# Patient Record
Sex: Male | Born: 1950 | Race: White | Hispanic: Yes | Marital: Single | Smoking: Current every day smoker
Health system: Southern US, Community
[De-identification: ages and names within clinical notes are randomized; demographics above are authoritative.]

## PROBLEM LIST (undated history)

## (undated) DIAGNOSIS — F1010 Alcohol abuse, uncomplicated: Secondary | ICD-10-CM

---

## 2015-09-19 ENCOUNTER — Encounter (HOSPITAL_BASED_OUTPATIENT_CLINIC_OR_DEPARTMENT_OTHER): Payer: Self-pay | Admitting: *Deleted

## 2015-09-19 ENCOUNTER — Inpatient Hospital Stay (HOSPITAL_BASED_OUTPATIENT_CLINIC_OR_DEPARTMENT_OTHER)
Admission: EM | Admit: 2015-09-19 | Discharge: 2015-10-03 | DRG: 438 | Disposition: A | Discharge status: Home / Self Care | Payer: Medicare Other | Attending: Internal Medicine | Admitting: Internal Medicine

## 2015-09-19 ENCOUNTER — Emergency Department (HOSPITAL_BASED_OUTPATIENT_CLINIC_OR_DEPARTMENT_OTHER): Payer: Medicare Other

## 2015-09-19 DIAGNOSIS — I959 Hypotension, unspecified: Secondary | ICD-10-CM | POA: Diagnosis present

## 2015-09-19 DIAGNOSIS — K861 Other chronic pancreatitis: Secondary | ICD-10-CM | POA: Diagnosis present

## 2015-09-19 DIAGNOSIS — R112 Nausea with vomiting, unspecified: Secondary | ICD-10-CM | POA: Diagnosis present

## 2015-09-19 DIAGNOSIS — K8020 Calculus of gallbladder without cholecystitis without obstruction: Secondary | ICD-10-CM | POA: Diagnosis present

## 2015-09-19 DIAGNOSIS — E876 Hypokalemia: Secondary | ICD-10-CM | POA: Diagnosis present

## 2015-09-19 DIAGNOSIS — Z789 Other specified health status: Secondary | ICD-10-CM | POA: Diagnosis present

## 2015-09-19 DIAGNOSIS — D509 Iron deficiency anemia, unspecified: Secondary | ICD-10-CM | POA: Diagnosis present

## 2015-09-19 DIAGNOSIS — K922 Gastrointestinal hemorrhage, unspecified: Secondary | ICD-10-CM | POA: Diagnosis present

## 2015-09-19 DIAGNOSIS — Z681 Body mass index (BMI) 19 or less, adult: Secondary | ICD-10-CM

## 2015-09-19 DIAGNOSIS — F1020 Alcohol dependence, uncomplicated: Secondary | ICD-10-CM | POA: Diagnosis present

## 2015-09-19 DIAGNOSIS — F1090 Alcohol use, unspecified, uncomplicated: Secondary | ICD-10-CM | POA: Diagnosis present

## 2015-09-19 DIAGNOSIS — K432 Incisional hernia without obstruction or gangrene: Secondary | ICD-10-CM | POA: Diagnosis present

## 2015-09-19 DIAGNOSIS — K760 Fatty (change of) liver, not elsewhere classified: Secondary | ICD-10-CM | POA: Diagnosis present

## 2015-09-19 DIAGNOSIS — R109 Unspecified abdominal pain: Secondary | ICD-10-CM | POA: Insufficient documentation

## 2015-09-19 DIAGNOSIS — F17210 Nicotine dependence, cigarettes, uncomplicated: Secondary | ICD-10-CM | POA: Diagnosis present

## 2015-09-19 DIAGNOSIS — R1115 Cyclical vomiting syndrome unrelated to migraine: Secondary | ICD-10-CM

## 2015-09-19 DIAGNOSIS — I493 Ventricular premature depolarization: Secondary | ICD-10-CM | POA: Diagnosis present

## 2015-09-19 DIAGNOSIS — Z903 Acquired absence of stomach [part of]: Secondary | ICD-10-CM

## 2015-09-19 DIAGNOSIS — R739 Hyperglycemia, unspecified: Secondary | ICD-10-CM | POA: Diagnosis present

## 2015-09-19 DIAGNOSIS — E871 Hypo-osmolality and hyponatremia: Secondary | ICD-10-CM | POA: Diagnosis present

## 2015-09-19 DIAGNOSIS — Z7289 Other problems related to lifestyle: Secondary | ICD-10-CM | POA: Diagnosis present

## 2015-09-19 DIAGNOSIS — R1013 Epigastric pain: Secondary | ICD-10-CM | POA: Diagnosis not present

## 2015-09-19 DIAGNOSIS — E860 Dehydration: Secondary | ICD-10-CM | POA: Diagnosis present

## 2015-09-19 DIAGNOSIS — K859 Acute pancreatitis without necrosis or infection, unspecified: Secondary | ICD-10-CM | POA: Diagnosis not present

## 2015-09-19 DIAGNOSIS — E43 Unspecified severe protein-calorie malnutrition: Secondary | ICD-10-CM | POA: Insufficient documentation

## 2015-09-19 DIAGNOSIS — F1010 Alcohol abuse, uncomplicated: Secondary | ICD-10-CM | POA: Diagnosis present

## 2015-09-19 HISTORY — DX: Alcohol abuse, uncomplicated: F10.10

## 2015-09-19 LAB — CBC WITH DIFFERENTIAL/PLATELET
BASOS ABS: 0 10*3/uL (ref 0.0–0.1)
BASOS PCT: 0 %
EOS PCT: 0 %
Eosinophils Absolute: 0 10*3/uL (ref 0.0–0.7)
HCT: 27 % — ABNORMAL LOW (ref 39.0–52.0)
Hemoglobin: 9.6 g/dL — ABNORMAL LOW (ref 13.0–17.0)
LYMPHS PCT: 8 %
Lymphs Abs: 1.2 10*3/uL (ref 0.7–4.0)
MCH: 33.1 pg (ref 26.0–34.0)
MCHC: 35.6 g/dL (ref 30.0–36.0)
MCV: 93.1 fL (ref 78.0–100.0)
Monocytes Absolute: 1.5 10*3/uL — ABNORMAL HIGH (ref 0.1–1.0)
Monocytes Relative: 11 %
NEUTROS ABS: 11.1 10*3/uL — AB (ref 1.7–7.7)
Neutrophils Relative %: 81 %
PLATELETS: 378 10*3/uL (ref 150–400)
RBC: 2.9 MIL/uL — AB (ref 4.22–5.81)
RDW: 12.3 % (ref 11.5–15.5)
WBC: 13.9 10*3/uL — AB (ref 4.0–10.5)

## 2015-09-19 LAB — COMPREHENSIVE METABOLIC PANEL
ALBUMIN: 2 g/dL — AB (ref 3.5–5.0)
ALBUMIN: 1.9 g/dL — AB (ref 3.5–5.0)
ALK PHOS: 161 U/L — AB (ref 38–126)
ALT: 17 U/L (ref 17–63)
ALT: 17 U/L (ref 17–63)
AST: 21 U/L (ref 15–41)
AST: 23 U/L (ref 15–41)
Alkaline Phosphatase: 160 U/L — ABNORMAL HIGH (ref 38–126)
Anion gap: 12 (ref 5–15)
Anion gap: 13 (ref 5–15)
BUN: 7 mg/dL (ref 6–20)
BUN: 7 mg/dL (ref 6–20)
CALCIUM: 7.2 mg/dL — AB (ref 8.9–10.3)
CHLORIDE: 71 mmol/L — AB (ref 101–111)
CO2: 40 mmol/L — AB (ref 22–32)
CO2: 38 mmol/L — AB (ref 22–32)
CREATININE: 0.64 mg/dL (ref 0.61–1.24)
CREATININE: 0.6 mg/dL — AB (ref 0.61–1.24)
Calcium: 7.5 mg/dL — ABNORMAL LOW (ref 8.9–10.3)
Chloride: 72 mmol/L — ABNORMAL LOW (ref 101–111)
GFR calc Af Amer: >60 mL/min (ref >60)
GFR calc Af Amer: >60 mL/min (ref >60)
GFR calc non Af Amer: >60 mL/min (ref >60)
GLUCOSE: 108 mg/dL — AB (ref 65–99)
GLUCOSE: 99 mg/dL (ref 65–99)
POTASSIUM: 1.8 mmol/L — AB (ref 3.5–5.1)
Potassium: 2 mmol/L — CL (ref 3.5–5.1)
Sodium: 123 mmol/L — ABNORMAL LOW (ref 135–145)
Sodium: 123 mmol/L — ABNORMAL LOW (ref 135–145)
Total Bilirubin: 1 mg/dL (ref 0.3–1.2)
Total Bilirubin: 1.3 mg/dL — ABNORMAL HIGH (ref 0.3–1.2)
Total Protein: 5.9 g/dL — ABNORMAL LOW (ref 6.5–8.1)
Total Protein: 5.8 g/dL — ABNORMAL LOW (ref 6.5–8.1)

## 2015-09-19 LAB — URINALYSIS, ROUTINE W REFLEX MICROSCOPIC
Bilirubin Urine: NEGATIVE
GLUCOSE, UA: NEGATIVE mg/dL
HGB URINE DIPSTICK: NEGATIVE
Ketones, ur: 15 mg/dL — AB
LEUKOCYTES UA: NEGATIVE
Nitrite: NEGATIVE
PH: 6 (ref 5.0–8.0)
PROTEIN: NEGATIVE mg/dL
SPECIFIC GRAVITY, URINE: 1.005 (ref 1.005–1.030)

## 2015-09-19 LAB — MAGNESIUM: Magnesium: 0.8 mg/dL — CL (ref 1.7–2.4)

## 2015-09-19 LAB — LIPASE, BLOOD: LIPASE: 18 U/L (ref 11–51)

## 2015-09-19 MED ORDER — MORPHINE SULFATE (PF) 2 MG/ML IV SOLN
2.0000 mg | Freq: Once | INTRAVENOUS | Status: AC
  Administered 2015-09-19: 2 mg via INTRAVENOUS
  Filled 2015-09-19: qty 1

## 2015-09-19 MED ORDER — SODIUM CHLORIDE 0.9 % IV BOLUS (SEPSIS)
500.0000 mL | Freq: Once | INTRAVENOUS | Status: AC
  Administered 2015-09-19: 500 mL via INTRAVENOUS

## 2015-09-19 MED ORDER — ONDANSETRON HCL 4 MG/2ML IJ SOLN
4.0000 mg | Freq: Once | INTRAMUSCULAR | Status: AC
  Administered 2015-09-19: 4 mg via INTRAVENOUS
  Filled 2015-09-19: qty 2

## 2015-09-19 MED ORDER — MAGNESIUM SULFATE 2 GM/50ML IV SOLN
2.0000 g | Freq: Once | INTRAVENOUS | Status: AC
  Administered 2015-09-19: 2 g via INTRAVENOUS
  Filled 2015-09-19: qty 50

## 2015-09-19 MED ORDER — POTASSIUM CHLORIDE 10 MEQ/100ML IV SOLN
10.0000 meq | Freq: Once | INTRAVENOUS | Status: AC
  Administered 2015-09-19: 10 meq via INTRAVENOUS
  Filled 2015-09-19: qty 100

## 2015-09-19 MED ORDER — POTASSIUM CHLORIDE 10 MEQ/100ML IV SOLN
10.0000 meq | INTRAVENOUS | Status: DC
  Administered 2015-09-19 – 2015-09-20 (×4): 10 meq via INTRAVENOUS
  Filled 2015-09-19 (×4): qty 100

## 2015-09-19 MED ORDER — POTASSIUM CHLORIDE CRYS ER 20 MEQ PO TBCR
40.0000 meq | EXTENDED_RELEASE_TABLET | Freq: Once | ORAL | Status: AC
  Administered 2015-09-19: 40 meq via ORAL
  Filled 2015-09-19: qty 2

## 2015-09-19 NOTE — ED Provider Notes (Addendum)
CSN: [id number]     Arrival date & time 09/19/15  1758 History  By signing my name below, I, Myles Gip, attest that this documentation has been prepared under the direction and in the presence of Alvira Monday, MD. Electronically Signed: Myles Gip, ED Scribe. 09/19/2015. 7:14 PM.    Chief Complaint  Patient presents with  . Abdominal Pain    The history is provided by the patient and a relative. The history is limited by a language barrier.   Pt friend interpreting  HPI Comments: Shannon Lester is a 65 y.o. male with a PSHx of an unknown abdominal surgery who presents to the ED with gradual onset, constant, non-radiating, burning epigastric abdominal pain onset 2 weeks ago. The pt has had associated vomiting for 3 days and swollen feet. The pt states that eating worsens the abdominal pain. The pt states he is a 3x per day ETOH user with very large beers. The pt denies fever, diarrhea, constipation, CP, shortness of breath, urinary issues, and abdominal cramping. The patient also denies having a hernia. The pt also denies taking ibuprofen excessively.   Past Medical History  Diagnosis Date  . ETOH abuse    History reviewed. No pertinent past surgical history. History reviewed. No pertinent family history. Social History  Substance Use Topics  . Smoking status: Current Every Day Smoker -- 0.50 packs/day    Types: Cigarettes  . Smokeless tobacco: None  . Alcohol Use: 3.6 oz/week    6 Cans of beer per week    Review of Systems  Constitutional: Negative for fever.  HENT: Negative for sore throat.   Eyes: Negative for visual disturbance.  Respiratory: Negative for shortness of breath.   Cardiovascular: Negative for chest pain.  Gastrointestinal: Positive for nausea, vomiting and abdominal pain. Negative for diarrhea and constipation.  Genitourinary: Negative for difficulty urinating.  Musculoskeletal: Positive for joint swelling. Negative for back pain and neck stiffness.   Skin: Negative for rash.  Neurological: Negative for syncope and headaches.   Allergies  Review of patient's allergies indicates no known allergies.  Home Medications   Prior to Admission medications   Not on File   BP 94/54 mmHg  Pulse 78  Temp(Src) 99.3 F (37.4 C) (Oral)  Resp 18  Ht 5\' 6"  (1.676 m)  Wt 114 lb 12.8 oz (52.073 kg)  BMI 18.54 kg/m2  SpO2 97% Physical Exam  Constitutional: He is oriented to person, place, and time. He appears well-developed and well-nourished. No distress.  HENT:  Head: Normocephalic and atraumatic.  Eyes: Conjunctivae and EOM are normal.  Neck: Normal range of motion.  Cardiovascular: Normal rate, regular rhythm, normal heart sounds and intact distal pulses.  Exam reveals no gallop and no friction rub.   No murmur heard. Pulmonary/Chest: Effort normal and breath sounds normal. No respiratory distress. He has no wheezes. He has no rales.  Abdominal: Soft. He exhibits no distension. There is tenderness. There is no guarding.  RUQ and Epigastric tenderness, no CVA tenderness, negative murphy's, no guarding, Midline scar  Musculoskeletal: He exhibits no edema.  Neurological: He is alert and oriented to person, place, and time.  Skin: Skin is warm and dry. He is not diaphoretic.  Nursing note and vitals reviewed.   ED Course  Procedures (including critical care time) DIAGNOSTIC STUDIES: Oxygen Saturation is 100% on RA, Normal by my interpretation.    COORDINATION OF CARE: 6:28 PM-Discussed treatment plan which includes lab work, and [place] RUQ, and pain medications for the  pain with pt at bedside and pt agreed to plan.     Labs Review Labs Reviewed  CBC WITH DIFFERENTIAL/PLATELET - Abnormal; Notable for the following:    WBC 13.9 (*)    RBC 2.90 (*)    Hemoglobin 9.6 (*)    HCT 27.0 (*)    Neutro Abs 11.1 (*)    Monocytes Absolute 1.5 (*)    All other components within normal limits  COMPREHENSIVE METABOLIC PANEL - Abnormal; Notable  for the following:    Sodium 123 (*)    Potassium 1.8 (*)    Chloride 71 (*)    CO2 40 (*)    Glucose, Bld 108 (*)    Calcium 7.5 (*)    Total Protein 5.9 (*)    Albumin 2.0 (*)    Alkaline Phosphatase 160 (*)    All other components within normal limits  URINALYSIS, ROUTINE W REFLEX MICROSCOPIC (NOT AT [facility]) - Abnormal; Notable for the following:    Ketones, ur 15 (*)    All other components within normal limits  COMPREHENSIVE METABOLIC PANEL - Abnormal; Notable for the following:    Sodium 123 (*)    Potassium 2.0 (*)    Chloride 72 (*)    CO2 38 (*)    Creatinine, Ser 0.60 (*)    Calcium 7.2 (*)    Total Protein 5.8 (*)    Albumin 1.9 (*)    Alkaline Phosphatase 161 (*)    Total Bilirubin 1.3 (*)    All other components within normal limits  MAGNESIUM - Abnormal; Notable for the following:    Magnesium 0.8 (*)    All other components within normal limits  COMPREHENSIVE METABOLIC PANEL - Abnormal; Notable for the following:    Sodium 124 (*)    Potassium 2.7 (*)    Chloride 79 (*)    CO2 35 (*)    Glucose, Bld 132 (*)    BUN <5 (*)    Calcium 7.1 (*)    Total Protein 5.0 (*)    Albumin 1.6 (*)    ALT 15 (*)    Alkaline Phosphatase 143 (*)    All other components within normal limits  MAGNESIUM - Abnormal; Notable for the following:    Magnesium 0.9 (*)    All other components within normal limits  BLOOD GAS, VENOUS - Abnormal; Notable for the following:    pH, Ven 7.573 (*)    pCO2, Ven 41.9 (*)    pO2, Ven 61.0 (*)    Bicarbonate 38.7 (*)    Acid-Base Excess 15.0 (*)    All other components within normal limits  OSMOLALITY, URINE - Abnormal; Notable for the following:    Osmolality, Ur 268 (*)    All other components within normal limits  OSMOLALITY - Abnormal; Notable for the following:    Osmolality 256 (*)    All other components within normal limits  BASIC METABOLIC PANEL - Abnormal; Notable for the following:    Sodium 124 (*)    Potassium 2.8 (*)     Chloride 81 (*)    CO2 35 (*)    Glucose, Bld 154 (*)    BUN <5 (*)    Calcium 7.3 (*)    All other components within normal limits  IRON AND TIBC - Abnormal; Notable for the following:    Iron 17 (*)    All other components within normal limits  FERRITIN - Abnormal; Notable for the following:  Ferritin 416 (*)    All other components within normal limits  COMPREHENSIVE METABOLIC PANEL - Abnormal; Notable for the following:    Sodium 125 (*)    Potassium 3.1 (*)    Chloride 84 (*)    Glucose, Bld 204 (*)    BUN <5 (*)    Creatinine, Ser 0.56 (*)    Calcium 7.6 (*)    Total Protein 5.0 (*)    Albumin 1.5 (*)    ALT 14 (*)    Alkaline Phosphatase 144 (*)    All other components within normal limits  MAGNESIUM - Abnormal; Notable for the following:    Magnesium 0.9 (*)    All other components within normal limits  LIPASE, BLOOD  NA AND K (SODIUM & POTASSIUM), RAND UR  TSH  MAGNESIUM  VITAMIN B12  HIV ANTIBODY (ROUTINE TESTING)  OCCULT BLOOD X 1 CARD TO LAB, STOOL    Imaging Review Ct Abdomen Pelvis W Contrast  09/20/2015  CLINICAL DATA:  Epigastric abdominal pain for 2 weeks. Vomiting. Lower extremity swelling. Inpatient. EXAM: CT ABDOMEN AND PELVIS WITH CONTRAST TECHNIQUE: Multidetector CT imaging of the abdomen and pelvis was performed using the standard protocol following bolus administration of intravenous contrast. CONTRAST:  80mL ISOVUE-300 IOPAMIDOL (ISOVUE-300) INJECTION 61% COMPARISON:  09/19/2015 abdominal sonogram. FINDINGS: Lower chest: No significant pulmonary nodules or acute consolidative airspace disease. Trace layering bilateral pleural effusions and minimal dependent atelectasis in both lower lobes. Hepatobiliary: Normal size liver. Diffuse hepatic steatosis. No liver mass. No definite liver surface irregularity. Nondistended gallbladder contains a 9 mm calcified gallstone. No gallbladder wall thickening or pericholecystic fat stranding. No biliary ductal  dilatation. Common bile duct diameter 5 mm. Pancreas: Coarse calcifications in the pancreatic head. Thickening and parenchymal heterogeneity throughout the pancreatic body and tail with associated peripancreatic fluid and fat stranding. These findings are consistent with acute on chronic pancreatitis. There are a few low-attenuation pancreatic body lesions, largest measuring 1.1 x 0.8 cm and 1.3 x 1.0 cm (series 2/ image 21). No areas of pancreatic parenchymal nonenhancement or gas. Spleen: Normal size. No mass. Adrenals/Urinary Tract: Normal adrenals. No hydronephrosis. No renal masses. Normal bladder. Stomach/Bowel: Status post distal gastrectomy with gastrojejunostomy. No definite wall thickening in the collapsed remnant proximal stomach. Normal caliber small bowel with no small bowel wall thickening. Normal appendix. Normal large bowel with no diverticulosis, large bowel wall thickening or pericolonic fat stranding. Vascular/Lymphatic: Atherosclerotic nonaneurysmal abdominal aorta. Patent portal, splenic, hepatic and renal veins. Enlarged 1.7 cm gastrohepatic ligament node (series 2/ image 15). Porta hepatis lymphadenopathy up to 1.5 cm (series 2/image 16). No additional pathologically enlarged abdominopelvic nodes. Reproductive: Normal size prostate. Other: No pneumoperitoneum, ascites or focal intraperitoneal fluid collection. Musculoskeletal: No aggressive appearing focal osseous lesions. Moderate degenerative changes in the visualized thoracolumbar spine. IMPRESSION: 1. Acute on chronic pancreatitis. No evidence of necrotizing pancreatitis. 2. Small low-attenuation lesions in the pancreatic body, favor chronic and/or developing pseudocysts. Recommend attention on a follow-up CT abdomen with IV contrast or MRI abdomen without and with IV contrast in 3 months. 3. Trace layering bilateral pleural effusions. 4. Nonspecific mild upper retroperitoneal lymphadenopathy, which can also be reassessed on follow-up CT  or MRI abdomen in 3 months. 5. Cholelithiasis. No evidence of acute cholecystitis. No biliary ductal dilatation. 6. Diffuse hepatic steatosis. Electronically Signed   By: Delbert Phenix M.D.   On: 09/20/2015 18:47   Dg Chest Port 1 View  09/20/2015  CLINICAL DATA:  Hyponatremia.  Alcohol use. EXAM:  PORTABLE CHEST 1 VIEW COMPARISON:  None. FINDINGS: Slight interstitial pattern to the lung bases may indicate fibrosis or linear atelectasis. No focal consolidation. No blunting of costophrenic angles. No pneumothorax. Normal heart size and pulmonary vascularity. Mediastinal contours appear intact. IMPRESSION: Slight fibrosis or atelectasis in the lung bases. No evidence of active pulmonary disease. Electronically Signed   By: Burman Nieves M.D.   On: 09/20/2015 05:53   US Abdomen Limited Ruq  09/19/2015  CLINICAL DATA:  Epigastric pain for 2 weeks, vomiting for 3 days. History of ETOH abuse. EXAM: US ABDOMEN LIMITED - RIGHT UPPER QUADRANT COMPARISON:  None. FINDINGS: Gallbladder: Sludge and stones within the mildly distended gallbladder, largest stone measuring 9 mm greatest dimension. No gallbladder wall thickening, pericholecystic fluid or other secondary signs of acute cholecystitis. Common bile duct: Diameter: Normal at 5 mm. Liver: Liver is diffusely echogenic indicating fatty infiltration. No focal mass or lesion identified within the liver, although the fatty infiltration limits characterization of parenchymal detail. IMPRESSION: 1. Sludge and stones within the gallbladder, largest stone measuring 9 mm. No evidence of acute cholecystitis. 2. No bile duct dilatation. 3. Fatty infiltration of the liver. Electronically Signed   By: Bary Richard M.D.   On: 09/19/2015 20:11   I have personally reviewed and evaluated these images and lab results as part of my medical decision-making.   EKG Interpretation   Date/Time:  Tuesday September 19 2015 19:57:40 EDT Ventricular Rate:  75 PR Interval:  145 QRS  Duration: 94 QT Interval:  440 QTC Calculation: 491 R Axis:   42 Text Interpretation:  Sinus rhythm Paired ventricular premature complexes  Low voltage, extremity leads Borderline prolonged QT interval No previous  ECGs available Confirmed by YAO  MD, DAVID ([id number]) on 09/20/2015 9:32:16 PM      MDM   Final diagnoses:  Epigastric abdominal pain  Hypomagnesemia  Hypokalemia  Symptomatic cholelithiasis   64yo male with history of etoh abuse presents with weeks of epigastric pain, worse with eating. US shows no cholecystitis however shows cholelithiasis and would recommend at least outpt surgery evaluation for concern for symptomatic cholelithiasis (consider inpt if symptoms severe and persistent as intpt) Lipase WNL. ETOH gastritis may also contribute to symptoms.  Patient with severe hypomagnesemia and hypokalemia. Also severe hyponatremia, possible this is chronic secondary to etoh use.  ECG borderline prolonged QTc, no other significant changes.  Initiated K and Mg replacement and will admit for further care.    I personally performed the services described in this documentation, which was scribed in my presence. The recorded information has been reviewed and is accurate.     Alvira Monday, MD 09/21/15 1325  Alvira Monday, MD 09/21/15 1351

## 2015-09-19 NOTE — ED Notes (Signed)
[name] his friend may be reached to pick him up [phone]

## 2015-09-19 NOTE — ED Notes (Signed)
Pt in ultrasound

## 2015-09-19 NOTE — ED Notes (Signed)
Urinal given, notified pt that urine sample is needed 

## 2015-09-19 NOTE — ED Notes (Signed)
Pt c/o epigastric pain x 3 with n/v. HX alcohol abuse

## 2015-09-19 NOTE — Progress Notes (Signed)
This is a no charge note  [facility]  65 year old man with past medical history of alcohol abuse and tobacco abuse, who presents with nausea, vomiting, epigastric abdominal pain, which is burning like pain. Patient was found to have hyponatremia with sodium of 123 with normal mental status, potassium 1.8, lipase 18, negative UA, WBC 13.9, normal transaminases, total bilirubin 1.3. Abdominal ultrasound showed sludge and stones within the gallbladder, largest stone measuring 9 mm. No evidence of acute cholecystitis, no bile duct dilatation. Pt is accepted to tele bed for obs. Pt was given 2 g of magnesium sulfate and total of 100 mEq of KCl were ordered.   Lorretta HarpXilin Divine Imber, MD  [facility] Pager [phone]  If 7PM-7AM, please contact night-coverage [email or web address] Password [email or web address] 09/19/2015, 9:59 PM

## 2015-09-20 ENCOUNTER — Encounter (HOSPITAL_COMMUNITY): Payer: Self-pay | Admitting: Radiology

## 2015-09-20 ENCOUNTER — Observation Stay (HOSPITAL_COMMUNITY): Payer: Medicare Other

## 2015-09-20 DIAGNOSIS — R1013 Epigastric pain: Secondary | ICD-10-CM | POA: Insufficient documentation

## 2015-09-20 DIAGNOSIS — E876 Hypokalemia: Secondary | ICD-10-CM | POA: Diagnosis present

## 2015-09-20 DIAGNOSIS — K760 Fatty (change of) liver, not elsewhere classified: Secondary | ICD-10-CM | POA: Diagnosis present

## 2015-09-20 DIAGNOSIS — E43 Unspecified severe protein-calorie malnutrition: Secondary | ICD-10-CM | POA: Diagnosis present

## 2015-09-20 DIAGNOSIS — E871 Hypo-osmolality and hyponatremia: Secondary | ICD-10-CM | POA: Diagnosis present

## 2015-09-20 DIAGNOSIS — F1010 Alcohol abuse, uncomplicated: Secondary | ICD-10-CM | POA: Diagnosis not present

## 2015-09-20 DIAGNOSIS — D509 Iron deficiency anemia, unspecified: Secondary | ICD-10-CM | POA: Diagnosis present

## 2015-09-20 DIAGNOSIS — Z789 Other specified health status: Secondary | ICD-10-CM | POA: Diagnosis present

## 2015-09-20 DIAGNOSIS — Z903 Acquired absence of stomach [part of]: Secondary | ICD-10-CM | POA: Diagnosis not present

## 2015-09-20 DIAGNOSIS — K858 Other acute pancreatitis without necrosis or infection: Secondary | ICD-10-CM | POA: Diagnosis not present

## 2015-09-20 DIAGNOSIS — I959 Hypotension, unspecified: Secondary | ICD-10-CM | POA: Diagnosis present

## 2015-09-20 DIAGNOSIS — K8020 Calculus of gallbladder without cholecystitis without obstruction: Secondary | ICD-10-CM | POA: Diagnosis present

## 2015-09-20 DIAGNOSIS — R739 Hyperglycemia, unspecified: Secondary | ICD-10-CM | POA: Diagnosis present

## 2015-09-20 DIAGNOSIS — R112 Nausea with vomiting, unspecified: Secondary | ICD-10-CM

## 2015-09-20 DIAGNOSIS — F1020 Alcohol dependence, uncomplicated: Secondary | ICD-10-CM | POA: Diagnosis present

## 2015-09-20 DIAGNOSIS — F17210 Nicotine dependence, cigarettes, uncomplicated: Secondary | ICD-10-CM | POA: Diagnosis present

## 2015-09-20 DIAGNOSIS — G43A1 Cyclical vomiting, intractable: Secondary | ICD-10-CM | POA: Diagnosis not present

## 2015-09-20 DIAGNOSIS — K922 Gastrointestinal hemorrhage, unspecified: Secondary | ICD-10-CM | POA: Diagnosis present

## 2015-09-20 DIAGNOSIS — K859 Acute pancreatitis without necrosis or infection, unspecified: Secondary | ICD-10-CM | POA: Diagnosis present

## 2015-09-20 DIAGNOSIS — K432 Incisional hernia without obstruction or gangrene: Secondary | ICD-10-CM | POA: Diagnosis present

## 2015-09-20 DIAGNOSIS — Z7289 Other problems related to lifestyle: Secondary | ICD-10-CM | POA: Diagnosis present

## 2015-09-20 DIAGNOSIS — G43A0 Cyclical vomiting, not intractable: Secondary | ICD-10-CM | POA: Diagnosis not present

## 2015-09-20 DIAGNOSIS — I493 Ventricular premature depolarization: Secondary | ICD-10-CM | POA: Diagnosis present

## 2015-09-20 DIAGNOSIS — R000 Tachycardia, unspecified: Secondary | ICD-10-CM | POA: Diagnosis not present

## 2015-09-20 DIAGNOSIS — K861 Other chronic pancreatitis: Secondary | ICD-10-CM | POA: Diagnosis present

## 2015-09-20 DIAGNOSIS — E860 Dehydration: Secondary | ICD-10-CM | POA: Diagnosis present

## 2015-09-20 DIAGNOSIS — Z681 Body mass index (BMI) 19 or less, adult: Secondary | ICD-10-CM | POA: Diagnosis not present

## 2015-09-20 LAB — VITAMIN B12: VITAMIN B 12: 342 pg/mL (ref 180–914)

## 2015-09-20 LAB — BLOOD GAS, VENOUS
ACID-BASE EXCESS: 15 mmol/L — AB (ref 0.0–2.0)
Bicarbonate: 38.7 mEq/L — ABNORMAL HIGH (ref 20.0–24.0)
DRAWN BY: 225631
FIO2: 0.21
O2 Saturation: 91.3 %
PCO2 VEN: 41.9 mmHg — AB (ref 45.0–50.0)
PH VEN: 7.573 — AB (ref 7.250–7.300)
PO2 VEN: 61 mmHg — AB (ref 31.0–45.0)
Patient temperature: 98.6
TCO2: 40 mmol/L (ref 0–100)

## 2015-09-20 LAB — COMPREHENSIVE METABOLIC PANEL
ALT: 15 U/L — ABNORMAL LOW (ref 17–63)
AST: 21 U/L (ref 15–41)
Albumin: 1.6 g/dL — ABNORMAL LOW (ref 3.5–5.0)
Alkaline Phosphatase: 143 U/L — ABNORMAL HIGH (ref 38–126)
Anion gap: 10 (ref 5–15)
CHLORIDE: 79 mmol/L — AB (ref 101–111)
CO2: 35 mmol/L — ABNORMAL HIGH (ref 22–32)
Calcium: 7.1 mg/dL — ABNORMAL LOW (ref 8.9–10.3)
Creatinine, Ser: 0.64 mg/dL (ref 0.61–1.24)
Glucose, Bld: 132 mg/dL — ABNORMAL HIGH (ref 65–99)
POTASSIUM: 2.7 mmol/L — AB (ref 3.5–5.1)
Sodium: 124 mmol/L — ABNORMAL LOW (ref 135–145)
Total Bilirubin: 0.8 mg/dL (ref 0.3–1.2)
Total Protein: 5 g/dL — ABNORMAL LOW (ref 6.5–8.1)

## 2015-09-20 LAB — TSH: TSH: 1.49 u[IU]/mL (ref 0.350–4.500)

## 2015-09-20 LAB — IRON AND TIBC: Iron: 17 ug/dL — ABNORMAL LOW (ref 45–182)

## 2015-09-20 LAB — NA AND K (SODIUM & POTASSIUM), RAND UR
POTASSIUM UR: 26 mmol/L
SODIUM UR: 68 mmol/L

## 2015-09-20 LAB — BASIC METABOLIC PANEL
ANION GAP: 8 (ref 5–15)
CALCIUM: 7.3 mg/dL — AB (ref 8.9–10.3)
CO2: 35 mmol/L — AB (ref 22–32)
Chloride: 81 mmol/L — ABNORMAL LOW (ref 101–111)
Creatinine, Ser: 0.65 mg/dL (ref 0.61–1.24)
GFR calc Af Amer: >60 mL/min (ref >60)
GLUCOSE: 154 mg/dL — AB (ref 65–99)
Potassium: 2.8 mmol/L — ABNORMAL LOW (ref 3.5–5.1)
Sodium: 124 mmol/L — ABNORMAL LOW (ref 135–145)

## 2015-09-20 LAB — OSMOLALITY: OSMOLALITY: 256 mosm/kg — AB (ref 275–295)

## 2015-09-20 LAB — FERRITIN: FERRITIN: 416 ng/mL — AB (ref 24–336)

## 2015-09-20 LAB — MAGNESIUM
Magnesium: 0.9 mg/dL — CL (ref 1.7–2.4)
Magnesium: 2.1 mg/dL (ref 1.7–2.4)

## 2015-09-20 LAB — OSMOLALITY, URINE: OSMOLALITY UR: 268 mosm/kg — AB (ref 300–900)

## 2015-09-20 MED ORDER — MORPHINE SULFATE (PF) 2 MG/ML IV SOLN
2.0000 mg | INTRAVENOUS | Status: DC | PRN
  Administered 2015-09-20 – 2015-09-21 (×2): 2 mg via INTRAVENOUS
  Filled 2015-09-20 (×2): qty 1

## 2015-09-20 MED ORDER — POTASSIUM CHLORIDE 10 MEQ/100ML IV SOLN
10.0000 meq | INTRAVENOUS | Status: AC
  Administered 2015-09-20 (×2): 10 meq via INTRAVENOUS
  Filled 2015-09-20 (×2): qty 100

## 2015-09-20 MED ORDER — THIAMINE HCL 100 MG/ML IJ SOLN
100.0000 mg | Freq: Every day | INTRAMUSCULAR | Status: DC
  Filled 2015-09-20: qty 2

## 2015-09-20 MED ORDER — ONDANSETRON HCL 4 MG/2ML IJ SOLN
4.0000 mg | Freq: Four times a day (QID) | INTRAMUSCULAR | Status: DC | PRN
  Administered 2015-09-20: 4 mg via INTRAVENOUS
  Filled 2015-09-20: qty 2

## 2015-09-20 MED ORDER — POTASSIUM CHLORIDE CRYS ER 20 MEQ PO TBCR
40.0000 meq | EXTENDED_RELEASE_TABLET | Freq: Two times a day (BID) | ORAL | Status: DC
  Administered 2015-09-20 – 2015-09-21 (×4): 40 meq via ORAL
  Filled 2015-09-20 (×4): qty 2

## 2015-09-20 MED ORDER — IOPAMIDOL (ISOVUE-300) INJECTION 61%
80.0000 mL | Freq: Once | INTRAVENOUS | Status: AC | PRN
  Administered 2015-09-20: 80 mL via INTRAVENOUS

## 2015-09-20 MED ORDER — VITAMIN B-1 100 MG PO TABS
100.0000 mg | ORAL_TABLET | Freq: Every day | ORAL | Status: DC
  Administered 2015-09-20 – 2015-10-03 (×14): 100 mg via ORAL
  Filled 2015-09-20 (×15): qty 1

## 2015-09-20 MED ORDER — FOLIC ACID 1 MG PO TABS
1.0000 mg | ORAL_TABLET | Freq: Every day | ORAL | Status: DC
Start: 2015-09-20 — End: 2015-10-03
  Administered 2015-09-20 – 2015-10-03 (×14): 1 mg via ORAL
  Filled 2015-09-20 (×14): qty 1

## 2015-09-20 MED ORDER — DIATRIZOATE MEGLUMINE & SODIUM 66-10 % PO SOLN
ORAL | Status: AC
  Administered 2015-09-20 (×2)
  Filled 2015-09-20: qty 30

## 2015-09-20 MED ORDER — LORAZEPAM 2 MG/ML IJ SOLN: 1.0000 mg | Freq: Four times a day (QID) | INTRAMUSCULAR | Status: DC | PRN

## 2015-09-20 MED ORDER — NICOTINE 14 MG/24HR TD PT24
14.0000 mg | MEDICATED_PATCH | Freq: Every day | TRANSDERMAL | Status: DC
  Administered 2015-09-20 – 2015-10-03 (×14): 14 mg via TRANSDERMAL
  Filled 2015-09-20 (×15): qty 1

## 2015-09-20 MED ORDER — MAGNESIUM SULFATE 4 GM/100ML IV SOLN
4.0000 g | Freq: Once | INTRAVENOUS | Status: AC
  Administered 2015-09-20: 4 g via INTRAVENOUS
  Filled 2015-09-20: qty 100

## 2015-09-20 MED ORDER — LORAZEPAM 1 MG PO TABS: 1.0000 mg | ORAL_TABLET | Freq: Four times a day (QID) | ORAL | Status: DC | PRN

## 2015-09-20 MED ORDER — ADULT MULTIVITAMIN W/MINERALS CH
1.0000 | ORAL_TABLET | Freq: Every day | ORAL | Status: DC
  Administered 2015-09-20 – 2015-10-03 (×14): 1 via ORAL
  Filled 2015-09-20 (×15): qty 1

## 2015-09-20 MED ORDER — ENOXAPARIN SODIUM 40 MG/0.4ML ~~LOC~~ SOLN
40.0000 mg | Freq: Every day | SUBCUTANEOUS | Status: DC
  Administered 2015-09-20 – 2015-10-02 (×13): 40 mg via SUBCUTANEOUS
  Filled 2015-09-20 (×14): qty 0.4

## 2015-09-20 MED ORDER — SODIUM CHLORIDE 0.9 % IV SOLN
INTRAVENOUS | Status: DC
  Administered 2015-09-20: via INTRAVENOUS

## 2015-09-20 MED ORDER — IOPAMIDOL (ISOVUE-300) INJECTION 61%
INTRAVENOUS | Status: AC
  Filled 2015-09-20: qty 100

## 2015-09-20 MED ORDER — SODIUM CHLORIDE 0.9 % IV SOLN
INTRAVENOUS | Status: DC
  Administered 2015-09-20 – 2015-09-22 (×3): via INTRAVENOUS
  Filled 2015-09-20 (×10): qty 1000

## 2015-09-20 NOTE — H&P (Signed)
History and Physical    Shannon Lester AVW:[id number] DOB: 1950-06-15 DOA: 09/19/2015   PCP: No primary care provider on file. Chief Complaint:  Chief Complaint  Patient presents with  . Abdominal Pain    HPI: Shannon Lester is a 65 y.o. male with medical history significant of unknown abdominal surgery in the past.  Patient presents to the ED at [facility] with c/o 2 week history of gradual onset, constant, non-radiating, burning epigastric and abdominal pain.  Patient has now had 3 day history of associated leg swelling and vomiting.  Eating makes symptoms worse.  No fever, CP, constipation, diarrhea, SOB.  ED Course: Multiple metabolic derangements including hypokalemia, hypomagnesemia, hyponatremia, high bicarb, were found on BMP, repeat BMP confirms their presence.  Patient transferred to [facility].  Review of Systems: As per HPI otherwise 10 point review of systems negative.    Past Medical History  Diagnosis Date  . ETOH abuse     History reviewed. No pertinent past surgical history.   reports that he has been smoking Cigarettes.  He has been smoking about 0.50 packs per day. He does not have any smokeless tobacco history on file. He reports that he drinks about 3.6 oz of alcohol per week. He reports that he does not use illicit drugs.  No Known Allergies  History reviewed. No pertinent family history.   Prior to Admission medications   Not on File    Physical Exam: Filed Vitals:   09/19/15 2152 09/19/15 2200 09/19/15 2218 09/19/15 2313  BP: 117/66 126/67 99/63 126/70  Pulse: 73 59 81 70  Temp:    98.4 F (36.9 C)  TempSrc:    Oral  Resp: Height:     (1.676 m)  Weight:    51.347 kg (113 lb 3.2 oz)  SpO2: 96% 97% 98% 98%      Constitutional: NAD, calm, comfortable Eyes: PERRL, lids and conjunctivae normal ENMT: Mucous membranes are moist. Posterior pharynx clear of any exudate or lesions.Normal dentition.  Neck: normal, supple, no masses, no  thyromegaly Respiratory: clear to auscultation bilaterally, no wheezing, no crackles. Normal respiratory effort. No accessory muscle use.  Cardiovascular: Regular rate and rhythm, no murmurs / rubs / gallops. No extremity edema. 2+ pedal pulses. No carotid bruits.  Abdomen: no tenderness, no masses palpated. No hepatosplenomegaly. Bowel sounds positive.  Musculoskeletal: no clubbing / cyanosis. No joint deformity upper and lower extremities. Good ROM, no contractures. Normal muscle tone.  Skin: no rashes, lesions, ulcers. No induration Neurologic: CN 2-12 grossly intact. Sensation intact, DTR normal. Strength 5/5 in all 4.  Psychiatric: Normal judgment and insight. Alert and oriented x 3. Normal mood.    Labs on Admission: I have personally reviewed following labs and imaging studies  CBC:  Recent Labs Lab 09/19/15 1837  WBC 13.9*  NEUTROABS 11.1*  HGB 9.6*  HCT 27.0*  MCV 93.1  PLT 378   Basic Metabolic Panel:  Recent Labs Lab 09/19/15 1837 09/19/15 2005  NA 123* 123*  K 1.8* 2.0*  CL 71* 72*  CO2 40* 38*  GLUCOSE 108* 99  BUN 7 7  CREATININE 0.64 0.60*  CALCIUM 7.5* 7.2*  MG 0.8*  --    GFR: Estimated Creatinine Clearance: 66.8 mL/min (by C-G formula based on Cr of 0.6). Liver Function Tests:  Recent Labs Lab 09/19/15 1837 09/19/15 2005  AST 21 23  ALT 17 17  ALKPHOS 160* 161*  BILITOT 1.0 1.3*  PROT 5.9* 5.8*  ALBUMIN 2.0* 1.9*    Recent Labs Lab 09/19/15 1837  LIPASE 18   No results for input(s): AMMONIA in the last 168 hours. Coagulation Profile: No results for input(s): INR, PROTIME in the last 168 hours. Cardiac Enzymes: No results for input(s): CKTOTAL, CKMB, CKMBINDEX, TROPONINI in the last 168 hours. BNP (last 3 results) No results for input(s): PROBNP in the last 8760 hours. HbA1C: No results for input(s): HGBA1C in the last 72 hours. CBG: No results for input(s): GLUCAP in the last 168 hours. Lipid Profile: No results for input(s):  CHOL, HDL, LDLCALC, TRIG, CHOLHDL, LDLDIRECT in the last 72 hours. Thyroid Function Tests: No results for input(s): TSH, T4TOTAL, FREET4, T3FREE, THYROIDAB in the last 72 hours. Anemia Panel: No results for input(s): VITAMINB12, FOLATE, FERRITIN, TIBC, IRON, RETICCTPCT in the last 72 hours. Urine analysis:    Component Value Date/Time   COLORURINE YELLOW 09/19/2015 2042   APPEARANCEUR CLEAR 09/19/2015 2042   LABSPEC 1.005 09/19/2015 2042   PHURINE 6.0 09/19/2015 2042   GLUCOSEU NEGATIVE 09/19/2015 2042   HGBUR NEGATIVE 09/19/2015 2042   BILIRUBINUR NEGATIVE 09/19/2015 2042   KETONESUR 15* 09/19/2015 2042   PROTEINUR NEGATIVE 09/19/2015 2042   NITRITE NEGATIVE 09/19/2015 2042   LEUKOCYTESUR NEGATIVE 09/19/2015 2042   Sepsis Labs: @LABRCNTIP (procalcitonin:4,lacticidven:4) )No results found for this or any previous visit (from the past 240 hour(s)).   Radiological Exams on Admission: [place] Abdomen Limited Ruq  09/19/2015  CLINICAL DATA:  Epigastric pain for 2 weeks, vomiting for 3 days. History of ETOH abuse. EXAM: US ABDOMEN LIMITED - RIGHT UPPER QUADRANT COMPARISON:  None. FINDINGS: Gallbladder: Sludge and stones within the mildly distended gallbladder, largest stone measuring 9 mm greatest dimension. No gallbladder wall thickening, pericholecystic fluid or other secondary signs of acute cholecystitis. Common bile duct: Diameter: Normal at 5 mm. Liver: Liver is diffusely echogenic indicating fatty infiltration. No focal mass or lesion identified within the liver, although the fatty infiltration limits characterization of parenchymal detail. IMPRESSION: 1. Sludge and stones within the gallbladder, largest stone measuring 9 mm. No evidence of acute cholecystitis. 2. No bile duct dilatation. 3. Fatty infiltration of the liver. Electronically Signed   By: Bary RichardStan  Maynard M.D.   On: 09/19/2015 20:11    EKG: Independently reviewed.  Assessment/Plan Principal Problem:   Nausea with  vomiting Active Problems:   Hypokalemia   Hyponatremia   Hypomagnesemia   ETOH abuse   N/V - Unclear if this is the cause of the metabolic derangements or as a result of  zofran PRN  Plan to fix metabolic abnormalities and work these up further  Multiple metabolic abnormalities -  Replace magnesium and potassium  Repeat CMP in AM  NS at 75 cc/hr for now, increase to 125 if CXR clear (dosent show mass)  Urine lytes ordered to r/o SIADH  VBG ordered to look at cause of bicarb elevation  EtOH use - patient states he drinks 3 beers a day and no more than this.  Will put him on withdrawal protocol just in case but hold off on ordering ativan for now.     DVT prophylaxis: Lovenox Code Status: Full Family Communication: No family in room Consults called: None Admission status: Admit to obs   Oluwadamilare Tobler, Heywood IlesJARED M. DO [facility] Pager [phone] from 7PM-7AM  If 7AM-7PM, please contact the day physician for the patient [email or web address] Password [email or web address]  09/20/2015, 12:21 AM

## 2015-09-20 NOTE — Progress Notes (Signed)
PROGRESS NOTE                                                                                                                                                                                                             Patient Demographics:    Shannon Lester, is a 65 y.o. male, DOB - 1950/11/03, WUJ:[id number]  Admit date - 09/19/2015   Admitting Physician Lorretta Harp, MD  Outpatient Primary MD for the patient is No primary care provider on file.  LOS -   Outpatient Specialists:None   Chief Complaint  Patient presents with  . 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 Denies any vomiting, fevers, chills, hematemesis or melena. Denies any diarrhea or constipation. Reports he may have lost some weight. Denies any sick contact or recent travel. Does not take any medicines or see her doctor.  In the ED he was found to have severe hypokalemia, hypomagnesemia and hyponatremia and patient transferred to [facility].   Subjective:   Patient reports mild epigastric discomfort but no nausea. Reports poor by mouth intake.   Assessment  & Plan :    Principal Problem: Abdominal pain with nausea Unclear if this is related to gastritis with poor by mouth intake versus severe malabsorption. -Continue to monitor on telemetry. Replenishing electrolytes aggressively with both by mouth and IV fluids. -Has epigastric tenderness to palpation. Check stool for occult blood, iron panel. Check CT of the abdomen and pelvis with contrast to evaluate further. Has elevated alkaline phosphatase but with normal total bili and  transaminases. Ultrasound abdomen shows sludge and stones within gallbladder, largest one measuring 9 mm no acute cholecystitis and no bile duct dilatation. Shows fatty changes. No right upper quadrant tenderness on exam.  Active Problems: Severe Hypokalemia and hypomagnesemia Aggressively replenished. Monitor electrolytes in the evening.    Hyponatremia Possibly hypovolemic. Urine sodium normal. Check urine and serum osmolality.  Alcohol abuse Reports drinking 3 beers a day. Counseled on cessation. Monitor on CIWA.  Code Status : Full code  Family Communication  : None at bedside  Disposition Plan  : Home Once symptoms and electrolyte improves.  Barriers For Discharge : Active symptoms, abnormal electrolytes  Consults  :  None  Procedures  :  Ultrasound abdomen CT abdomen and pelvis (pending)  DVT Prophylaxis  :  Lovenox -  Lab Results  Component Value Date   PLT 378 09/19/2015    Antibiotics  :    Anti-infectives    None        Objective:   Filed Vitals:   09/19/15 2218 09/19/15 2313 09/20/15 0444 09/20/15 1200  BP: 99/63 126/70 107/63 101/56  Pulse: 81 70 75 80  Temp:  98.4 F (36.9 C) 99.1 F (37.3 C) 98.9 F (37.2 C)  TempSrc:  Oral Oral Oral  Resp: 19 18 18 18   Height:  5\' 6"  (1.676 m)    Weight:  51.347 kg (113 lb 3.2 oz) 51.665 kg (113 lb 14.4 oz)   SpO2: 98% 98% 97% 99%    Wt Readings from Last 3 Encounters:  09/20/15 51.665 kg (113 lb 14.4 oz)     Intake/Output Summary (Last 24 hours) at 09/20/15 1334 Last data filed at 09/20/15 1227  Gross per 24 hour  Intake 2017.5 ml  Output   2500 ml  Net -482.5 ml     Physical Exam  Gen: not in distress HEENT: no pallor, moist mucosa, supple neck, Temporal wasting Chest: clear b/l, no added sounds CVS: N S1&S2, no murmurs, rubs or gallop GI: soft, midline laparotomy scar, nondistended, bowel sounds present. Mild epigastric tenderness Musculoskeletal: warm, no edema CNS:  Alert and oriented    Data Review:    CBC  Recent Labs Lab 09/19/15 1837  WBC 13.9*  HGB 9.6*  HCT 27.0*  PLT 378  MCV 93.1  MCH 33.1  MCHC 35.6  RDW 12.3  LYMPHSABS 1.2  MONOABS 1.5*  EOSABS 0.0  BASOSABS 0.0    Chemistries   Recent Labs Lab 09/19/15 1837 09/19/15 2005 09/20/15 0328 09/20/15 0749  NA 123* 123* 124*  --   K 1.8* 2.0* 2.7*  --   CL 71* 72* 79*  --   CO2 40* 38* 35*  --   GLUCOSE 108* 99 132*  --   BUN 7 7 <5*  --   CREATININE 0.64 0.60* 0.64  --   CALCIUM 7.5* 7.2* 7.1*  --   MG 0.8*  --   --  0.9*  AST 21 23 21   --   ALT 17 17 15*  --   ALKPHOS 160* 161* 143*  --   BILITOT 1.0 1.3* 0.8  --    ------------------------------------------------------------------------------------------------------------------ No results for input(s): CHOL, HDL, LDLCALC, TRIG, CHOLHDL, LDLDIRECT in the last 72 hours.  No results found for: HGBA1C ------------------------------------------------------------------------------------------------------------------  Recent Labs  09/20/15 0749  TSH 1.490   ------------------------------------------------------------------------------------------------------------------ No results for input(s): VITAMINB12, FOLATE, FERRITIN, TIBC, IRON, RETICCTPCT in the last 72 hours.  Coagulation profile No results for input(s): INR, PROTIME in the last 168 hours.  No results for input(s): DDIMER in the last 72 hours.  Cardiac Enzymes No results for input(s): CKMB, TROPONINI, MYOGLOBIN in the last 168 hours.  Invalid input(s): CK ------------------------------------------------------------------------------------------------------------------ No results found for: BNP  Inpatient Medications  Scheduled Meds: . enoxaparin (LOVENOX) injection  40 mg Subcutaneous Daily  . folic acid  1 mg Oral Daily  . multivitamin with minerals  1 tablet Oral Daily  . nicotine  14 mg Transdermal Daily  . potassium chloride  40 mEq Oral  BID  . thiamine  100 mg Oral Daily   Or  . thiamine  100 mg Intravenous Daily   Continuous Infusions: . sodium chloride 0.9 % 1,000 mL with potassium chloride 40 mEq infusion 125 mL/hr at 09/20/15 0833   PRN Meds:.morphine injection, ondansetron (ZOFRAN) IV  Micro Results No results found for this or any previous visit (from the past 240 hour(s)).  Radiology Reports Dg Chest Port 1 View  09/20/2015  CLINICAL DATA:  Hyponatremia.  Alcohol use. EXAM: PORTABLE CHEST 1 VIEW COMPARISON:  None. FINDINGS: Slight interstitial pattern to the lung bases may indicate fibrosis or linear atelectasis. No focal consolidation. No blunting of costophrenic angles. No pneumothorax. Normal heart size and pulmonary vascularity. Mediastinal contours appear intact. IMPRESSION: Slight fibrosis or atelectasis in the lung bases. No evidence of active pulmonary disease. Electronically Signed   By: Burman NievesWilliam  Stevens M.D.   On: 09/20/2015 05:53   [place] Abdomen Limited Ruq  09/19/2015  CLINICAL DATA:  Epigastric pain for 2 weeks, vomiting for 3 days. History of ETOH abuse. EXAM: US ABDOMEN LIMITED - RIGHT UPPER QUADRANT COMPARISON:  None. FINDINGS: Gallbladder: Sludge and stones within the mildly distended gallbladder, largest stone measuring 9 mm greatest dimension. No gallbladder wall thickening, pericholecystic fluid or other secondary signs of acute cholecystitis. Common bile duct: Diameter: Normal at 5 mm. Liver: Liver is diffusely echogenic indicating fatty infiltration. No focal mass or lesion identified within the liver, although the fatty infiltration limits characterization of parenchymal detail. IMPRESSION: 1. Sludge and stones within the gallbladder, largest stone measuring 9 mm. No evidence of acute cholecystitis. 2. No bile duct dilatation. 3. Fatty infiltration of the liver. Electronically Signed   By: Bary RichardStan  Maynard M.D.   On: 09/19/2015 20:11    Time Spent in minutes  25   Eddie NorthHUNGEL, Brownie Gockel M.D on 09/20/2015 at  1:34 PM  Between 7am to 7pm - Pager - [phone]  After 7pm go to [email or web address] - password [email or web address]  [facility] -  Office  [phone]

## 2015-09-20 NOTE — Care Management Obs Status (Signed)
MEDICARE OBSERVATION STATUS NOTIFICATION   Patient Details  Name: Rance MuirServando Keats MRN: [id number] Date of Birth: 12-29-1950   Medicare Observation Status Notification Given:  Yes  Document explained to patient via Spanish Interpreter Garcella; pt refused to sign form. Abelino DerrickB Ebbie Sorenson RN  Cherrie DistanceChandler, Shahida Schnackenberg L, RN 09/20/2015, 4:39 PM

## 2015-09-20 NOTE — Progress Notes (Signed)
Critical lab results, mag = 0.09.  MD notified, awaiting orders.

## 2015-09-20 NOTE — Progress Notes (Signed)
CRITICAL VALUE ALERT  Critical value received:  0430  Date of notification:  09-20-15  Time of notification:  0435  Critical value read back: Yes  Nurse who received alert:  Laqueisha Catalina B. Dudley MajorFerrette, RN  MD notified (1st page):  Laban EmperorGardner, J.  Time of first page:  0450  MD notified (2nd page):  Time of second page:  Responding MD:  J. GARDNER  Time MD responded:  [phone]

## 2015-09-20 NOTE — Progress Notes (Signed)
At CT [id number], notified that pt has completed oral contrast x2 bottles required for CT Abdomen Pelvis with contrast.

## 2015-09-21 DIAGNOSIS — K861 Other chronic pancreatitis: Secondary | ICD-10-CM

## 2015-09-21 DIAGNOSIS — K859 Acute pancreatitis without necrosis or infection, unspecified: Principal | ICD-10-CM

## 2015-09-21 LAB — COMPREHENSIVE METABOLIC PANEL
ALK PHOS: 144 U/L — AB (ref 38–126)
ALT: 14 U/L — AB (ref 17–63)
ANION GAP: 10 (ref 5–15)
AST: 21 U/L (ref 15–41)
Albumin: 1.5 g/dL — ABNORMAL LOW (ref 3.5–5.0)
BILIRUBIN TOTAL: 0.8 mg/dL (ref 0.3–1.2)
CALCIUM: 7.6 mg/dL — AB (ref 8.9–10.3)
CO2: 31 mmol/L (ref 22–32)
CREATININE: 0.56 mg/dL — AB (ref 0.61–1.24)
Chloride: 84 mmol/L — ABNORMAL LOW (ref 101–111)
GFR calc Af Amer: >60 mL/min (ref >60)
GFR calc non Af Amer: >60 mL/min (ref >60)
GLUCOSE: 204 mg/dL — AB (ref 65–99)
Potassium: 3.1 mmol/L — ABNORMAL LOW (ref 3.5–5.1)
Sodium: 125 mmol/L — ABNORMAL LOW (ref 135–145)
Total Protein: 5 g/dL — ABNORMAL LOW (ref 6.5–8.1)

## 2015-09-21 LAB — HIV ANTIBODY (ROUTINE TESTING W REFLEX): HIV SCREEN 4TH GENERATION: NONREACTIVE

## 2015-09-21 LAB — OCCULT BLOOD X 1 CARD TO LAB, STOOL: Fecal Occult Bld: NEGATIVE

## 2015-09-21 LAB — MAGNESIUM: Magnesium: 0.9 mg/dL — CL (ref 1.7–2.4)

## 2015-09-21 MED ORDER — MAGNESIUM SULFATE 4 GM/100ML IV SOLN
4.0000 g | Freq: Once | INTRAVENOUS | Status: AC
  Administered 2015-09-21: 4 g via INTRAVENOUS
  Filled 2015-09-21: qty 100

## 2015-09-21 MED ORDER — MAGNESIUM OXIDE 400 (241.3 MG) MG PO TABS
400.0000 mg | ORAL_TABLET | Freq: Two times a day (BID) | ORAL | Status: DC
  Administered 2015-09-21 – 2015-09-24 (×7): 400 mg via ORAL
  Filled 2015-09-21 (×7): qty 1

## 2015-09-21 MED ORDER — MAGNESIUM OXIDE 400 (241.3 MG) MG PO TABS
400.0000 mg | ORAL_TABLET | Freq: Two times a day (BID) | ORAL | Status: DC
  Administered 2015-09-21: 400 mg via ORAL
  Filled 2015-09-21: qty 1

## 2015-09-21 MED ORDER — POTASSIUM CHLORIDE CRYS ER 20 MEQ PO TBCR
40.0000 meq | EXTENDED_RELEASE_TABLET | Freq: Once | ORAL | Status: AC
  Administered 2015-09-21: 40 meq via ORAL
  Filled 2015-09-21: qty 2

## 2015-09-21 MED ORDER — ACETAMINOPHEN 325 MG PO TABS
650.0000 mg | ORAL_TABLET | Freq: Four times a day (QID) | ORAL | Status: DC | PRN
  Administered 2015-09-21 – 2015-09-28 (×4): 650 mg via ORAL
  Filled 2015-09-21 (×4): qty 2

## 2015-09-21 NOTE — Progress Notes (Signed)
Interpreter Wyvonnia DuskyGraciela Namihira for Amy PA

## 2015-09-21 NOTE — Progress Notes (Signed)
PROGRESS NOTE                                                                                                                                                                                                             Patient Demographics:    Shannon Lester, is a 65 y.o. male, DOB - 1951-01-24, [id number]  Admit date - 09/19/2015   Admitting Physician Lorretta HarpXilin Niu, MD  Outpatient Primary MD for the patient is No primary care provider on file.  LOS - 1  Outpatient Specialists:None   Chief Complaint  Patient presents with  . 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 Denies any vomiting, fevers, chills, hematemesis or melena. Denies any diarrhea or constipation. Reports he may have lost some weight. Denies any sick contact or recent travel. Does not take any medicines or see his doctor.  Patient is a poor historian despite having interpretor present. [place] in [place] friends, lives in [place]. Epigastric pain started 1 month ago, that is constant. Pain worsens with food and therefore has not been eating much.  He has tried Tylenol and aspirin with no relief. He denies ever experiencing an episode like this before. His BM is 3-4x/day loose and appears poorly digested if he eats that day. Denies steatorrhea. He started drinking when he was 65 years old and for the past 30+ years, he has been drinking 3 bottles of beer/day.  In the ED he was found to have severe hypokalemia, hypomagnesemia and hyponatremia and patient transferred to [facility].   Subjective:   Patient still with some  epigastric pain. No N/V. Last BM yesterday. Had solid diet this morning.    Assessment  & Plan :    Principal Problem: Acute on Chronic Pancreatitis  - Continue to monitor on telemetry. Replenishing electrolytes aggressively with both by mouth and IV fluids. - Switch to full liquid diet. Continue pain management.  - CT abdomen shows acute on chronic pancreatitis with no evidence of necrotizing pancreatitis. Also shows small low-attenuation lesions  in the pancreatic body, favor chronic and/or developing pseudocysts. - Has epigastric tenderness to palpation. Check stool for occult blood. Has elevated alkaline phosphatase but with normal total bili and transaminases.  - Ultrasound abdomen shows sludge and stones within gallbladder, largest one measuring 9 mm no acute cholecystitis and no bile duct dilatation. Shows fatty changes. No right upper quadrant tenderness on exam. CT abdomen supports ultrasound findings, cholelithiasis with no evidence of acute cholecystitis. No biliary ductal dilation -suspect this is acute on chr pancreatitis from etoh use. Will need pancreatic enzyme supplement on discharge.  Active Problems: Severe Hypokalemia and hypomagnesemia - Repeat Potassium level at 3.1, improved from 2.8. Continue KCl 40 mEq infusion with NaCl. Continue KCl BID PO.  - Repeat Magnesium level at 0.9, dropped from 2.1. Start Magnesium sulfate 4g IV x1 dose. Start Magnesium oxide 400mg  BID PO  -PVCs on tele. - Continue to closely monitor levels   Hyponatremia - Possibly hypovolemic - Urine sodium normal - Serum and urine osmolality slightly decreased.   Anemia - Patient with history of gastric surgery, unsure of reason/procedure per patient. Patient also with history of alcohol abuse.  - CT abdomen shows diffuse hepatic steatosis  - Hgb 9.6 at admission, no prior values available  - iron panel suggests iron deficiency, possibly from malabsorption with gastric resection, chr etoh use ,  ? UGI bleed - Vit B12 at 342 - Continue thiamine 100mg  PO Qdaily and Folic acid 1mg  PO Qdaily  Alcohol abuse - Reports drinking 3 beers a day for 30+ years.  - Counseled on cessation. Discussed with patient that acute pancreatitis likely due to alcohol abuse and possibility of recurrence with continuation of alcohol use - Monitor on CIWA  Tobacco use - Patient reports smoking about 0.5 packs per day - Counseled on cessation. Discussed with patient that smoking can accelerate progression of pancreatitis.  Hyperglycemia  check A1C   Code Status : Full code  Family Communication  : None at bedside  Disposition Plan  : Home Once symptoms and electrolyte improves.  Barriers For Discharge : Active symptoms, abnormal electrolytes  Consults  :  None  Procedures  :  Ultrasound abdomen CT abdomen and pelvis  DVT Prophylaxis  :  Lovenox -  Lab Results  Component Value Date   PLT 378 09/19/2015    Antibiotics  :    Anti-infectives    None        Objective:   Filed Vitals:   09/20/15 1200 09/20/15 2033 09/21/15 0000 09/21/15 0556  BP: 101/56 110/62  103/62  Pulse: 80 83 81 86  Temp: 98.9 F (37.2 C) 99.1 F (37.3 C)  99.8 F (37.7 C)  TempSrc: Oral Oral  Oral  Resp: 18 18  18   Height:      Weight:    52.073 kg (114 lb 12.8 oz)  SpO2: 99% 98%  99%    Wt Readings from Last 3 Encounters:  09/21/15 52.073 kg (114 lb 12.8 oz)     Intake/Output Summary (Last 24 hours) at 09/21/15 0943 Last data filed at 09/21/15 0600  Gross per 24 hour  Intake 4536.25 ml  Output   4300 ml  Net 236.25 ml     Physical Exam  Gen: 6in no acute distress HEENT: no pallor, moist mucosa, supple neck, Temporal wasting Chest: clear b/l, no added sounds. Normal respiratory efforts CVS: N S1&S2, no murmurs, rubs or gallop GI: soft, midline laparotomy scar, nondistended, bowel sounds present. Mild epigastric tenderness.  Negative murphy sign. No rash or bruising noted.   Musculoskeletal: warm, no edema CNS: Alert and oriented    Data Review:    CBC  Recent Labs Lab 09/19/15 1837  WBC 13.9*  HGB 9.6*  HCT 27.0*  PLT 378  MCV 93.1  MCH 33.1  MCHC 35.6  RDW 12.3  LYMPHSABS 1.2  MONOABS 1.5*  EOSABS 0.0  BASOSABS 0.0    Chemistries   Recent Labs Lab 09/19/15 1837 09/19/15 2005 09/20/15 0328 09/20/15 0749 09/20/15 1617  NA 123* 123* 124*  --  124*  K 1.8* 2.0* 2.7*  --  2.8*  CL 71* 72* 79*  --  81*  CO2 40* 38* 35*  --  35*  GLUCOSE 108* 99 132*  --  154*  BUN 7 7 <5*  --  <5*  CREATININE 0.64 0.60* 0.64  --  0.65  CALCIUM 7.5* 7.2* 7.1*  --  7.3*  MG 0.8*  --   --  0.9* 2.1  AST 21 23 21   --   --   ALT 17 17 15*  --   --   ALKPHOS 160* 161* 143*  --   --   BILITOT 1.0 1.3* 0.8  --   --    ------------------------------------------------------------------------------------------------------------------ No results for input(s): CHOL, HDL, LDLCALC, TRIG, CHOLHDL, LDLDIRECT in the last 72 hours.  No results found for: HGBA1C ------------------------------------------------------------------------------------------------------------------  Recent Labs  09/20/15 0749  TSH 1.490   ------------------------------------------------------------------------------------------------------------------  Recent Labs  09/20/15 1443  VITAMINB12 342  FERRITIN 416*  TIBC NOT CALCULATED  IRON 17*    Coagulation profile No results for input(s): INR, PROTIME in the last 168 hours.  No results for input(s): DDIMER in the last 72 hours.  Cardiac Enzymes No results for input(s): CKMB, TROPONINI, MYOGLOBIN in the last 168 hours.  Invalid input(s): CK ------------------------------------------------------------------------------------------------------------------ No results found for: BNP  Inpatient Medications  Scheduled Meds: . enoxaparin (LOVENOX) injection  40 mg Subcutaneous Daily  . folic acid  1 mg Oral Daily  .  multivitamin with minerals  1 tablet Oral Daily  . nicotine  14 mg Transdermal Daily  . potassium chloride  40 mEq Oral BID  . thiamine  100 mg Oral Daily   Or  . thiamine  100 mg Intravenous Daily   Continuous Infusions: . sodium chloride 0.9 % 1,000 mL with potassium chloride 40 mEq infusion 125 mL/hr at 09/20/15 0833   PRN Meds:.morphine injection, ondansetron (ZOFRAN) IV  Micro Results No results found for this or any previous visit (from the past 240 hour(s)).  Radiology Reports Ct Abdomen Pelvis W Contrast  09/20/2015  CLINICAL DATA:  Epigastric abdominal pain for 2 weeks. Vomiting. Lower extremity swelling. Inpatient. EXAM: CT ABDOMEN AND PELVIS WITH CONTRAST TECHNIQUE: Multidetector CT imaging of the abdomen and pelvis was performed using the standard protocol following bolus administration of intravenous contrast. CONTRAST:  80mL ISOVUE-300 IOPAMIDOL (ISOVUE-300) INJECTION 61% COMPARISON:  09/19/2015 abdominal sonogram. FINDINGS: Lower chest: No significant pulmonary nodules or acute consolidative airspace disease. Trace layering bilateral pleural effusions and minimal dependent atelectasis in both lower lobes. Hepatobiliary: Normal size liver. Diffuse hepatic steatosis. No liver mass. No definite liver surface irregularity. Nondistended gallbladder contains a 9 mm calcified gallstone. No gallbladder wall thickening or pericholecystic fat stranding. No biliary ductal dilatation. Common bile duct diameter 5 mm. Pancreas: Coarse calcifications in the pancreatic head. Thickening and parenchymal heterogeneity throughout the pancreatic body and tail with associated peripancreatic fluid and fat stranding. These findings are  consistent with acute on chronic pancreatitis. There are a few low-attenuation pancreatic body lesions, largest measuring 1.1 x 0.8 cm and 1.3 x 1.0 cm (series 2/ image 21). No areas of pancreatic parenchymal nonenhancement or gas. Spleen: Normal size. No mass.  Adrenals/Urinary Tract: Normal adrenals. No hydronephrosis. No renal masses. Normal bladder. Stomach/Bowel: Status post distal gastrectomy with gastrojejunostomy. No definite wall thickening in the collapsed remnant proximal stomach. Normal caliber small bowel with no small bowel wall thickening. Normal appendix. Normal large bowel with no diverticulosis, large bowel wall thickening or pericolonic fat stranding. Vascular/Lymphatic: Atherosclerotic nonaneurysmal abdominal aorta. Patent portal, splenic, hepatic and renal veins. Enlarged 1.7 cm gastrohepatic ligament node (series 2/ image 15). Porta hepatis lymphadenopathy up to 1.5 cm (series 2/image 16). No additional pathologically enlarged abdominopelvic nodes. Reproductive: Normal size prostate. Other: No pneumoperitoneum, ascites or focal intraperitoneal fluid collection. Musculoskeletal: No aggressive appearing focal osseous lesions. Moderate degenerative changes in the visualized thoracolumbar spine. IMPRESSION: 1. Acute on chronic pancreatitis. No evidence of necrotizing pancreatitis. 2. Small low-attenuation lesions in the pancreatic body, favor chronic and/or developing pseudocysts. Recommend attention on a follow-up CT abdomen with IV contrast or MRI abdomen without and with IV contrast in 3 months. 3. Trace layering bilateral pleural effusions. 4. Nonspecific mild upper retroperitoneal lymphadenopathy, which can also be reassessed on follow-up CT or MRI abdomen in 3 months. 5. Cholelithiasis. No evidence of acute cholecystitis. No biliary ductal dilatation. 6. Diffuse hepatic steatosis. Electronically Signed   By: Delbert Phenix M.D.   On: 09/20/2015 18:47   Dg Chest Port 1 View  09/20/2015  CLINICAL DATA:  Hyponatremia.  Alcohol use. EXAM: PORTABLE CHEST 1 VIEW COMPARISON:  None. FINDINGS: Slight interstitial pattern to the lung bases may indicate fibrosis or linear atelectasis. No focal consolidation. No blunting of costophrenic angles. No pneumothorax.  Normal heart size and pulmonary vascularity. Mediastinal contours appear intact. IMPRESSION: Slight fibrosis or atelectasis in the lung bases. No evidence of active pulmonary disease. Electronically Signed   By: Burman Nieves M.D.   On: 09/20/2015 05:53   US Abdomen Limited Ruq  09/19/2015  CLINICAL DATA:  Epigastric pain for 2 weeks, vomiting for 3 days. History of ETOH abuse. EXAM: US ABDOMEN LIMITED - RIGHT UPPER QUADRANT COMPARISON:  None. FINDINGS: Gallbladder: Sludge and stones within the mildly distended gallbladder, largest stone measuring 9 mm greatest dimension. No gallbladder wall thickening, pericholecystic fluid or other secondary signs of acute cholecystitis. Common bile duct: Diameter: Normal at 5 mm. Liver: Liver is diffusely echogenic indicating fatty infiltration. No focal mass or lesion identified within the liver, although the fatty infiltration limits characterization of parenchymal detail. IMPRESSION: 1. Sludge and stones within the gallbladder, largest stone measuring 9 mm. No evidence of acute cholecystitis. 2. No bile duct dilatation. 3. Fatty infiltration of the liver. Electronically Signed   By: Bary Richard M.D.   On: 09/19/2015 20:11    Time Spent in minutes 25   Amy Yu PA-S on 09/21/2015 at 9:43 AM  Between 7am to 7pm - Pager - [phone]  After 7pm go to [email or web address] - password [facility]  [facility] -  Office  [phone]

## 2015-09-21 NOTE — Progress Notes (Signed)
Paged Dr. Gonzella Lexhungel regarding pt temp now 100.5 with no Tylenol PRN orders. No new orders. Will continue to monitor pt.       Jilda PandaBethany Riana Tessmer RN

## 2015-09-21 NOTE — Progress Notes (Addendum)
CRITICAL VALUE ALERT  Critical value received:  1039  Date of notification:  09/21/15  Time of notification:  1039  Critical value read back:yes  Nurse who received alert:  Teague Goynes  MD notified (1st page): Dr. Gonzella Lexhungel  Time of first page:  1040 Time of second page: 1115 Time of third page: 1155

## 2015-09-21 NOTE — Progress Notes (Signed)
Interpreter Wyvonnia DuskyGraciela Namihira for [facility]

## 2015-09-21 NOTE — Care Management Important Message (Signed)
Important Message  Patient Details  Name: Rance MuirServando Tally MRN: [id number] Date of Birth: [birth year]   Medicare Important Message Given:  Yes    Bernadette HoitShoffner, Shizuko Wojdyla Coleman 09/21/2015, 9:34 AM

## 2015-09-22 DIAGNOSIS — K858 Other acute pancreatitis without necrosis or infection: Secondary | ICD-10-CM

## 2015-09-22 DIAGNOSIS — E43 Unspecified severe protein-calorie malnutrition: Secondary | ICD-10-CM

## 2015-09-22 LAB — TRIGLYCERIDES: TRIGLYCERIDES: 53 mg/dL (ref <150)

## 2015-09-22 LAB — MAGNESIUM: MAGNESIUM: 1.1 mg/dL — AB (ref 1.7–2.4)

## 2015-09-22 LAB — BASIC METABOLIC PANEL
Anion gap: 8 (ref 5–15)
CALCIUM: 8 mg/dL — AB (ref 8.9–10.3)
CO2: 27 mmol/L (ref 22–32)
CREATININE: 0.53 mg/dL — AB (ref 0.61–1.24)
Chloride: 90 mmol/L — ABNORMAL LOW (ref 101–111)
GFR calc Af Amer: >60 mL/min (ref >60)
GLUCOSE: 113 mg/dL — AB (ref 65–99)
Potassium: 5.1 mmol/L (ref 3.5–5.1)
SODIUM: 125 mmol/L — AB (ref 135–145)

## 2015-09-22 MED ORDER — BOOST / RESOURCE BREEZE PO LIQD
1.0000 | Freq: Three times a day (TID) | ORAL | Status: DC
  Administered 2015-09-22 – 2015-09-25 (×7): 1 via ORAL

## 2015-09-22 MED ORDER — PRO-STAT SUGAR FREE PO LIQD
30.0000 mL | Freq: Three times a day (TID) | ORAL | Status: DC
  Administered 2015-09-22 – 2015-09-26 (×13): 30 mL via ORAL
  Filled 2015-09-22 (×13): qty 30

## 2015-09-22 MED ORDER — SODIUM CHLORIDE 0.9 % IV SOLN
INTRAVENOUS | Status: DC
  Administered 2015-09-22 (×2): via INTRAVENOUS

## 2015-09-22 MED ORDER — MAGNESIUM SULFATE 4 GM/100ML IV SOLN
4.0000 g | Freq: Once | INTRAVENOUS | Status: AC
  Administered 2015-09-22: 4 g via INTRAVENOUS
  Filled 2015-09-22: qty 100

## 2015-09-22 NOTE — Care Management Important Message (Signed)
Important Message  Patient Details  Name: Shannon MuirServando Lester MRN: [id number] Date of Birth: 1950-10-25   Medicare Important Message Given:  Yes    Bernadette HoitShoffner, Jarryd Gratz Coleman 09/22/2015, 9:21 AM

## 2015-09-22 NOTE — Progress Notes (Signed)
Interpreter Wyvonnia DuskyGraciela Namihira for Amy PA and Reanne

## 2015-09-22 NOTE — Progress Notes (Addendum)
PROGRESS NOTE                                                                                                                                                                                                             Patient Demographics:    Shannon Lester, is a 65 y.o. male, DOB - [birth year], ZOX:[id number]  Admit date - 09/19/2015   Admitting Physician Lorretta Harp, MD  Outpatient Primary MD for the patient is No primary care provider on file.  LOS - 2  Outpatient Specialists:None   Chief Complaint  Patient presents with  . 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 Denies any vomiting, fevers, chills, hematemesis or melena. Denies any diarrhea or constipation. Reports he may have lost some weight. Denies any sick contact or recent travel. Does not take any medicines or see his doctor.  Patient is a poor historian despite having interpretor present. Patient in [place] visiting friends, lives in [place], [place]. Epigastric pain started 1 month ago, that is constant. Pain worsens with food and therefore has not been eating much.  He has tried Tylenol and aspirin with no relief. He denies ever experiencing an episode like this before. His BM is 3-4x/day loose and appears poorly digested if he eats that day. Denies steatorrhea. He started drinking when he was 65 years old and for the past 30+ years, he has been drinking 3 bottles of beer/day.  In the ED he was found to have severe hypokalemia, hypomagnesemia and hyponatremia and patient transferred to [facility].   Subjective:    feeling better today.  unhappy  about liquid diet. PA student spoke to the patient with the help of a Spanish interpreter. He reported polyuria and nocturia for the past 3-4 weeks. He reports blurred vision for several months. (was 58kg when in [place] 1 month ago, currently weighing around 51-52kg).   Assessment  & Plan :    Principal Problem: Acute on Chronic Pancreatitis  Replenishing electrolytes aggressively. Potassium improved. Still has luminal lesion which is  being repleted both by mouth and IV. -Pain better. Advance diet to regular. - CT abdomen shows acute on chronic pancreatitis with no evidence of necrotizing pancreatitis. Also shows small low-attenuation lesions in the pancreatic body, favor chronic and/or developing pseudocysts. - Has epigastric tenderness to palpation. FOBT negative. Has elevated alkaline phosphatase but with normal total bili and transaminases.  - Ultrasound abdomen shows sludge and stones within gallbladder, largest one measuring 9 mm no acute cholecystitis and no bile duct dilatation. Shows fatty changes. No right upper quadrant tenderness on exam. CT abdomen supports ultrasound findings, cholelithiasis with no evidence of acute cholecystitis. No biliary ductal dilation  - Suspect this is acute on chr pancreatitis from etoh use. Will need pancreatic enzyme supplement on discharge. - Patient not very clear about diagnosis and treatment plan despite help from the interpreter.  Active Problems: Severe Hypokalemia and hypomagnesemia -Potassium improved. Still low magnesium which is being replenished. Continue telemetry monitoring.   Hyponatremia - Possibly hypovolemic - Urine sodium normal - Serum and urine osmolality slightly decreased. Continue IV hydration.   Anemia - Patient with history of gastric surgery in [place] about 3 years back, unsure of reason/procedure per patient. Patient also with history of alcohol abuse.  - CT abdomen shows diffuse hepatic steatosis  - Hgb 9.6 at  admission, no prior values available  - Iron panel suggests iron deficiency, possibly from malabsorption with gastric resection, chr etoh use , ? UGI bleed - Vit B12 at 342 - FOBT negative - Continue thiamine and folate. Please iron supplement upon discharge.  Alcohol abuse - Reports drinking 3 beers a day for 30+ years.  - Counseled on cessation. Discussed with patient that acute pancreatitis likely due to alcohol abuse and possibility of recurrence with continuation of alcohol use - Monitor on CIWA  Tobacco use - Patient reports smoking about 0.5 packs per day - Counseled on cessation.   Hyperglycemia - Check A1C, reports polyuria and nocturia.  Severe protein calorie malnutrition. Added supplement.   Code Status : Full code  Family Communication  : None at bedside  Disposition Plan  : Home once symptoms and electrolyte improves. Possibly in the next 24-48 hours  Barriers For Discharge : Active symptoms, abnormal electrolytes  Consults  :  None  Procedures  :  Ultrasound abdomen CT abdomen and pelvis  DVT Prophylaxis  :  Lovenox -  Lab Results  Component Value Date   PLT 378 09/19/2015    Antibiotics  :    Anti-infectives    None        Objective:   Filed Vitals:   09/21/15 0937 09/21/15 1536 09/21/15 2155 09/22/15 0601  BP: 94/54 119/63 111/62 99/52  Pulse: 78 85 72 73  Temp: 99.3 F (37.4 C) 100.5 F (38.1 C) 98.7 F (37.1 C) 99.3 F (37.4 C)  TempSrc: Oral Oral Oral Oral  Resp: 18 18 20 18   Height:      Weight:    51.574 kg (113 lb 11.2 oz)  SpO2: 97% 99% 99% 98%    Wt Readings from Last 3 Encounters:  09/22/15 51.574 kg (113 lb 11.2 oz)     Intake/Output Summary (Last 24 hours) at 09/22/15 0906 Last data filed at 09/22/15 0749  Gross per 24 hour  Intake 3828.33 ml  Output   2305 ml  Net 1523.33 ml     Physical Exam  Gen: in no acute distress HEENT: Moist mucosa, supple neck Chest: clear b/l, no added sounds.  CVS:  S1 and S2  tachycardic, no murmurs GI: soft, midline laparotomy scar, nondistended, bowel sounds present. Small 3x3cm ventral hernia adjacent , reducible. Mild epigastric tenderness. Negative murphy sign. No rash or bruising noted.  Musculoskeletal: warm, no edema CNS: Alert and oriented    Data Review:    CBC  Recent Labs Lab 09/19/15 1837  WBC 13.9*  HGB 9.6*  HCT 27.0*  PLT 378  MCV 93.1  MCH 33.1  MCHC 35.6  RDW 12.3  LYMPHSABS 1.2  MONOABS 1.5*  EOSABS 0.0  BASOSABS 0.0    Chemistries   Recent Labs Lab 09/19/15 1837 09/19/15 2005 09/20/15 0328 09/20/15 0749 09/20/15 1617 09/21/15 0859 09/22/15 0408  NA 123* 123* 124*  --  124* 125* 125*  K 1.8* 2.0* 2.7*  --  2.8* 3.1* 5.1  CL 71* 72* 79*  --  81* 84* 90*  CO2 40* 38* 35*  --  35* 31 27  GLUCOSE 108* 99 132*  --  154* 204* 113*  BUN 7 7 <5*  --  <5* <5* <5*  CREATININE 0.64 0.60* 0.64  --  0.65 0.56* 0.53*  CALCIUM 7.5* 7.2* 7.1*  --  7.3* 7.6* 8.0*  MG 0.8*  --   --  0.9* 2.1 0.9* 1.1*  AST 21 23 21   --   --  21  --   ALT 17 17 15*  --   --  14*  --   ALKPHOS 160* 161* 143*  --   --  144*  --   BILITOT 1.0 1.3* 0.8  --   --  0.8  --    ------------------------------------------------------------------------------------------------------------------  Recent Labs  09/22/15 0408  TRIG 53    No results found for: HGBA1C ------------------------------------------------------------------------------------------------------------------  Recent Labs  09/20/15 0749  TSH 1.490   ------------------------------------------------------------------------------------------------------------------  Recent Labs  09/20/15 1443  VITAMINB12 342  FERRITIN 416*  TIBC NOT CALCULATED  IRON 17*    Coagulation profile No results for input(s): INR, PROTIME in the last 168 hours.  No results for input(s): DDIMER in the last 72 hours.  Cardiac Enzymes No results for input(s): CKMB, TROPONINI, MYOGLOBIN in the last  168 hours.  Invalid input(s): CK ------------------------------------------------------------------------------------------------------------------ No results found for: BNP  Inpatient Medications  Scheduled Meds: . enoxaparin (LOVENOX) injection  40 mg Subcutaneous Daily  . folic acid  1 mg Oral Daily  . magnesium oxide  400 mg Oral BID  . magnesium sulfate 1 - 4 g bolus IVPB  4 g Intravenous Once  . multivitamin with minerals  1 tablet Oral Daily  . nicotine  14 mg Transdermal Daily  . thiamine  100 mg Oral Daily   Or  . thiamine  100 mg Intravenous Daily   Continuous Infusions: . sodium chloride 0.9 % 1,000 mL with potassium chloride 40 mEq infusion 150 mL/hr at 09/22/15 0556   PRN Meds:.acetaminophen, morphine injection, ondansetron (ZOFRAN) IV  Micro Results No results found for this or any previous visit (from the past 240 hour(s)).  Radiology Reports Ct Abdomen Pelvis W Contrast  09/20/2015  CLINICAL DATA:  Epigastric abdominal pain for 2 weeks. Vomiting. Lower extremity swelling. Inpatient. EXAM: CT ABDOMEN AND PELVIS WITH CONTRAST TECHNIQUE: Multidetector CT imaging of the abdomen and pelvis was performed using the standard protocol following bolus administration of intravenous contrast. CONTRAST:  80mL ISOVUE-300 IOPAMIDOL (ISOVUE-300) INJECTION 61% COMPARISON:  09/19/2015 abdominal sonogram. FINDINGS: Lower chest: No significant pulmonary nodules or acute consolidative airspace disease. Trace layering bilateral pleural effusions and  minimal dependent atelectasis in both lower lobes. Hepatobiliary: Normal size liver. Diffuse hepatic steatosis. No liver mass. No definite liver surface irregularity. Nondistended gallbladder contains a 9 mm calcified gallstone. No gallbladder wall thickening or pericholecystic fat stranding. No biliary ductal dilatation. Common bile duct diameter 5 mm. Pancreas: Coarse calcifications in the pancreatic head. Thickening and parenchymal heterogeneity  throughout the pancreatic body and tail with associated peripancreatic fluid and fat stranding. These findings are consistent with acute on chronic pancreatitis. There are a few low-attenuation pancreatic body lesions, largest measuring 1.1 x 0.8 cm and 1.3 x 1.0 cm (series 2/ image 21). No areas of pancreatic parenchymal nonenhancement or gas. Spleen: Normal size. No mass. Adrenals/Urinary Tract: Normal adrenals. No hydronephrosis. No renal masses. Normal bladder. Stomach/Bowel: Status post distal gastrectomy with gastrojejunostomy. No definite wall thickening in the collapsed remnant proximal stomach. Normal caliber small bowel with no small bowel wall thickening. Normal appendix. Normal large bowel with no diverticulosis, large bowel wall thickening or pericolonic fat stranding. Vascular/Lymphatic: Atherosclerotic nonaneurysmal abdominal aorta. Patent portal, splenic, hepatic and renal veins. Enlarged 1.7 cm gastrohepatic ligament node (series 2/ image 15). Porta hepatis lymphadenopathy up to 1.5 cm (series 2/image 16). No additional pathologically enlarged abdominopelvic nodes. Reproductive: Normal size prostate. Other: No pneumoperitoneum, ascites or focal intraperitoneal fluid collection. Musculoskeletal: No aggressive appearing focal osseous lesions. Moderate degenerative changes in the visualized thoracolumbar spine. IMPRESSION: 1. Acute on chronic pancreatitis. No evidence of necrotizing pancreatitis. 2. Small low-attenuation lesions in the pancreatic body, favor chronic and/or developing pseudocysts. Recommend attention on a follow-up CT abdomen with IV contrast or MRI abdomen without and with IV contrast in 3 months. 3. Trace layering bilateral pleural effusions. 4. Nonspecific mild upper retroperitoneal lymphadenopathy, which can also be reassessed on follow-up CT or MRI abdomen in 3 months. 5. Cholelithiasis. No evidence of acute cholecystitis. No biliary ductal dilatation. 6. Diffuse hepatic steatosis.  Electronically Signed   By: Delbert Phenix M.D.   On: 09/20/2015 18:47   Dg Chest Port 1 View  09/20/2015  CLINICAL DATA:  Hyponatremia.  Alcohol use. EXAM: PORTABLE CHEST 1 VIEW COMPARISON:  None. FINDINGS: Slight interstitial pattern to the lung bases may indicate fibrosis or linear atelectasis. No focal consolidation. No blunting of costophrenic angles. No pneumothorax. Normal heart size and pulmonary vascularity. Mediastinal contours appear intact. IMPRESSION: Slight fibrosis or atelectasis in the lung bases. No evidence of active pulmonary disease. Electronically Signed   By: Burman Nieves M.D.   On: 09/20/2015 05:53   US Abdomen Limited Ruq  09/19/2015  CLINICAL DATA:  Epigastric pain for 2 weeks, vomiting for 3 days. History of ETOH abuse. EXAM: US ABDOMEN LIMITED - RIGHT UPPER QUADRANT COMPARISON:  None. FINDINGS: Gallbladder: Sludge and stones within the mildly distended gallbladder, largest stone measuring 9 mm greatest dimension. No gallbladder wall thickening, pericholecystic fluid or other secondary signs of acute cholecystitis. Common bile duct: Diameter: Normal at 5 mm. Liver: Liver is diffusely echogenic indicating fatty infiltration. No focal mass or lesion identified within the liver, although the fatty infiltration limits characterization of parenchymal detail. IMPRESSION: 1. Sludge and stones within the gallbladder, largest stone measuring 9 mm. No evidence of acute cholecystitis. 2. No bile duct dilatation. 3. Fatty infiltration of the liver. Electronically Signed   By: Bary Richard M.D.   On: 09/19/2015 20:11    Time Spent in minutes 25   Amy Yu PA-S on 09/22/2015 at 9:06 AM  Between 7am to 7pm - Pager - [phone]  After 7pm  go to [email or web address] - password [facility]  [facility] -  Office  [phone]

## 2015-09-22 NOTE — Progress Notes (Signed)
Initial Nutrition Assessment  DOCUMENTATION CODES:   Severe malnutrition in context of acute illness/injury, Underweight  INTERVENTION:  Provide Boost Breeze po TID, each supplement provides 250 kcal and 9 grams of protein Provide Pro-stat TID, each dose provides 100 kcal and 15 grams of protein Snacks TID Provide Multivitamin with minerals daily   NUTRITION DIAGNOSIS:   Malnutrition related to altered GI function as evidenced by percent weight loss, severe depletion of body fat, severe depletion of muscle mass.   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   Assessment done with translation by in house spanish interpreter, Wyvonnia DuskyGraciela Namihira. Pt states that he has been having symptoms with eating for the past 3 weeks. He states that the last time he ate a good meal was 2 weeks ago. He reports eating well and maintaining his weight at 58 kg prior to moving here from [place] 1 month ago. He reports eating poorly at breakfast this morning (on full liquid diet) due to not liking the food and getting the same thing every meal; denies any nausea or abdominal pain after eating this morning. Pt has severe muscle and severe fat wasting per nutrition focused physical exam. RD encouraged patient to follow a low fat diet with adequate protein. Provided Boost Breeze supplement which patient likes and is agreeable to consuming 2-3 times daily.   Labs: low sodium, low  chloride, low calcium, low magnesium, hemoglobin A1c pending  Diet Order:  Diet full liquid Room service appropriate?: Yes; Fluid consistency:: Thin; Fluid restriction:: 1200 mL Fluid  Skin:  Reviewed, no issues  Last BM:  6/15  Height:   Ht Readings from Last 1 Encounters:  09/19/15 5\' 6"  (1.676 m)    Weight:   Wt Readings from Last 1 Encounters:  09/22/15 113 lb 11.2 oz (51.574 kg)    Ideal Body Weight:  64.5 kg  BMI:  Body mass index is 18.36 kg/(m^2).  Estimated Nutritional Needs:   Kcal:  1800-2000  Protein:  75-85 grams  Fluid:  1.6 L/day  EDUCATION NEEDS:   No education needs identified at this time  Dorothea Ogleeanne Daris Aristizabal RD, LDN Inpatient Clinical Dietitian Pager: [phone] After Hours Pager: [phone]

## 2015-09-22 NOTE — Progress Notes (Signed)
Patient resting quietly in bed.  NS infusing @ 125 cc/hr.  Skin warm and dry.  Diet increased to regular with 1500cc/day fluid restriction.  Patient informed of diet change and he stated it will start with breakfast.  No voiced complaints.

## 2015-09-23 LAB — HEMOGLOBIN A1C
HEMOGLOBIN A1C: 5.6 % (ref 4.8–5.6)
MEAN PLASMA GLUCOSE: 114 mg/dL

## 2015-09-23 LAB — BASIC METABOLIC PANEL
ANION GAP: 7 (ref 5–15)
BUN: 6 mg/dL (ref 6–20)
CALCIUM: 7.5 mg/dL — AB (ref 8.9–10.3)
CO2: 26 mmol/L (ref 22–32)
Chloride: 89 mmol/L — ABNORMAL LOW (ref 101–111)
Creatinine, Ser: 0.53 mg/dL — ABNORMAL LOW (ref 0.61–1.24)
GFR calc Af Amer: >60 mL/min (ref >60)
GLUCOSE: 185 mg/dL — AB (ref 65–99)
Potassium: 3.1 mmol/L — ABNORMAL LOW (ref 3.5–5.1)
SODIUM: 122 mmol/L — AB (ref 135–145)

## 2015-09-23 LAB — MAGNESIUM: Magnesium: 1.1 mg/dL — ABNORMAL LOW (ref 1.7–2.4)

## 2015-09-23 MED ORDER — POTASSIUM CHLORIDE CRYS ER 20 MEQ PO TBCR
40.0000 meq | EXTENDED_RELEASE_TABLET | Freq: Once | ORAL | Status: AC
  Administered 2015-09-23: 40 meq via ORAL
  Filled 2015-09-23 (×2): qty 2

## 2015-09-23 MED ORDER — MAGNESIUM SULFATE 4 GM/100ML IV SOLN
4.0000 g | Freq: Once | INTRAVENOUS | Status: AC
Start: 2015-09-23 — End: 2015-09-23
  Administered 2015-09-23: 4 g via INTRAVENOUS
  Filled 2015-09-23: qty 100

## 2015-09-23 NOTE — Progress Notes (Signed)
PROGRESS NOTE                                                                                                                                                                                                             Patient Demographics:    Shannon Lester, is a 65 y.o. male, DOB - 01-29-1951, WUJ:[id number]  Admit date - 09/19/2015   Admitting Physician Lorretta Harp, MD  Outpatient Primary MD for the patient is No primary care provider on file.  LOS - 3  Outpatient Specialists:None   Chief Complaint  Patient presents with  . 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 Denies any vomiting, fevers, chills, hematemesis or melena. Denies any diarrhea or constipation. Reports he may have lost some weight. Denies any sick contact or recent travel. Does not take any medicines or see his doctor.  Patient is a poor historian despite having interpretor present. Patient in [place] visiting friends, lives in [place], [place]. Epigastric pain started 1 month ago, that is constant. Pain worsens with food and therefore has not been eating much.  He has tried Tylenol and aspirin with no relief. He denies ever experiencing an episode like this before. His BM is 3-4x/day loose and appears poorly digested if he eats that day. Denies steatorrhea. He started drinking when he was 65 years old and for the past 30+ years, he has been drinking 3 bottles of beer/day.  In the ED he was found to have severe hypokalemia, hypomagnesemia and hyponatremia and patient transferred to [facility].   Subjective:   Reports minimal pain in  epigastric area. Tolerating diet.   Assessment  & Plan :    Principal Problem: Acute on Chronic Pancreatitis  -Suspect alcohol induced. - Replenishing electrolytes aggressively. Continues to have low magnesium which is being replenished daily with IV and by mouth. Replenished low potassium. Tolerating advanced diet and pain improved. - CT abdomen shows acute on chronic pancreatitis with no evidence of necrotizing pancreatitis. Also shows small low-attenuation lesions in the pancreatic body,  favor chronic and/or developing pseudocysts. -Ultrasound abdomen shows sludge and gallbladder stone without Murphy sign or CBD dilatation. -  Will need pancreatic enzyme supplement on discharge.   Active Problems: Severe Hypokalemia and hypomagnesemia -Continue aggressive replacement.   Hyponatremia - Initially thought to be hypovolemic and received aggressive hydration. Normal urine sodium with low serum and urine osmolality. Serum sodium continues to drop. (122 today). I will stop IV fluids and place patient on strict fluid restriction given concern for SIADH. If unimproved will consult nephrology in the morning.   Iron deficiency anemia Needs iron supplement upon discharge. Normal B12.  Alcohol abuse Counseled on cessation. No withdrawal symptoms. Continue thiamine, folate and multivitamin.  Tobacco use - Counseled on cessation. Nicotine patch.  Hyperglycemia , Reports polyuria and nocturia. However A1c is 5.6.  Severe protein calorie malnutrition. Added supplement.   Code Status : Full code  Family Communication  : None at bedside  Disposition Plan  : Home once electrolytes improved.  Barriers For Discharge : abnormal electrolytes  Consults  :  None  Procedures  :  Ultrasound abdomen CT abdomen and pelvis  DVT Prophylaxis  :  Lovenox -  Lab Results  Component Value Date   PLT 378 09/19/2015    Antibiotics  :    Anti-infectives    None        Objective:    Filed Vitals:   09/22/15 2138 09/23/15 0528 09/23/15 1128 09/23/15 1225  BP: 122/62 99/43 116/74 103/56  Pulse: 88 72 74 84  Temp: 100.3 F (37.9 C) 99.4 F (37.4 C) 98.9 F (37.2 C) 99.8 F (37.7 C)  TempSrc: Oral Oral Oral Oral  Resp: 17 19  20   Height:      Weight:  51.6 kg (113 lb 12.1 oz)    SpO2: 98% 98% 99% 100%    Wt Readings from Last 3 Encounters:  09/23/15 51.6 kg (113 lb 12.1 oz)     Intake/Output Summary (Last 24 hours) at 09/23/15 1235 Last data filed at 09/23/15 1204  Gross per 24 hour  Intake   4360 ml  Output   2850 ml  Net   1510 ml     Physical Exam  Gen:No distress HEENT: Moist mucosa, supple neck Chest: clear b/l, no added sounds.  CVS: S1 and S2 normal, no murmurs GI: soft, midline laparotomy scar, nondistended, bowel sounds present. Small 3x3cm ventral hernia adjacent , reducible. Mild epigastric tenderness. Musculoskeletal: warm, no edema     Data Review:    CBC  Recent Labs Lab 09/19/15 1837  WBC 13.9*  HGB 9.6*  HCT 27.0*  PLT 378  MCV 93.1  MCH 33.1  MCHC 35.6  RDW 12.3  LYMPHSABS 1.2  MONOABS 1.5*  EOSABS 0.0  BASOSABS 0.0    Chemistries   Recent Labs Lab 09/19/15 1837 09/19/15 2005 09/20/15 0328 09/20/15 0749 09/20/15 1617 09/21/15 0859 09/22/15 0408 09/23/15 0828  NA 123* 123* 124*  --  124* 125* 125* 122*  K 1.8* 2.0* 2.7*  --  2.8* 3.1* 5.1 3.1*  CL 71* 72* 79*  --  81* 84* 90* 89*  CO2 40* 38* 35*  --  35* 31 27 26   GLUCOSE 108* 99 132*  --  154* 204* 113* 185*  BUN 7 7 <5*  --  <5* <5* <5* 6  CREATININE 0.64 0.60* 0.64  --  0.65 0.56* 0.53* 0.53*  CALCIUM 7.5* 7.2* 7.1*  --  7.3* 7.6* 8.0* 7.5*  MG 0.8*  --   --  0.9* 2.1 0.9* 1.1* 1.1*  AST 21 23 21   --   --  21  --   --   ALT 17 17 15*  --   --  14*  --   --   ALKPHOS 160* 161* 143*  --   --  144*  --   --   BILITOT 1.0 1.3* 0.8  --   --  0.8  --   --     ------------------------------------------------------------------------------------------------------------------  Recent Labs  09/22/15 0408  TRIG 53    Lab Results  Component Value Date   HGBA1C 5.6 09/20/2015   ------------------------------------------------------------------------------------------------------------------ No results for input(s): TSH, T4TOTAL, T3FREE, THYROIDAB in the last 72 hours.  Invalid input(s): FREET3 ------------------------------------------------------------------------------------------------------------------  Recent Labs  09/20/15 1443  VITAMINB12 342  FERRITIN 416*  TIBC NOT CALCULATED  IRON 17*    Coagulation profile No results for input(s): INR, PROTIME in the last 168 hours.  No results for input(s): DDIMER in the last 72 hours.  Cardiac Enzymes No results for input(s): CKMB, TROPONINI, MYOGLOBIN in the last 168 hours.  Invalid input(s): CK ------------------------------------------------------------------------------------------------------------------ No results found for: BNP  Inpatient Medications  Scheduled Meds: . enoxaparin (LOVENOX) injection  40 mg Subcutaneous Daily  . feeding supplement  1 Container Oral TID BM  . feeding supplement (PRO-STAT SUGAR FREE 64)  30 mL Oral TID WC  . folic acid  1 mg Oral Daily  . magnesium oxide  400 mg Oral BID  . magnesium sulfate 1 - 4 g bolus IVPB  4 g Intravenous Once  . multivitamin with minerals  1 tablet Oral Daily  . nicotine  14 mg Transdermal Daily  . thiamine  100 mg Oral Daily   Or  . thiamine  100 mg Intravenous Daily   Continuous Infusions:   PRN Meds:.acetaminophen, morphine injection, ondansetron (ZOFRAN) IV  Micro Results No results found for this or any previous visit (from the past 240 hour(s)).  Radiology Reports Ct Abdomen Pelvis W Contrast  09/20/2015  CLINICAL DATA:  Epigastric abdominal pain for 2 weeks. Vomiting. Lower extremity swelling.  Inpatient. EXAM: CT ABDOMEN AND PELVIS WITH CONTRAST TECHNIQUE: Multidetector CT imaging of the abdomen and pelvis was performed using the standard protocol following bolus administration of intravenous contrast. CONTRAST:  80mL ISOVUE-300 IOPAMIDOL (ISOVUE-300) INJECTION 61% COMPARISON:  09/19/2015 abdominal sonogram. FINDINGS: Lower chest: No significant pulmonary nodules or acute consolidative airspace disease. Trace layering bilateral pleural effusions and minimal dependent atelectasis in both lower lobes. Hepatobiliary: Normal size liver. Diffuse hepatic steatosis. No liver mass. No definite liver surface irregularity. Nondistended gallbladder contains a 9 mm calcified gallstone. No gallbladder wall thickening or pericholecystic fat stranding. No biliary ductal dilatation. Common bile duct diameter 5 mm. Pancreas: Coarse calcifications in the pancreatic head. Thickening and parenchymal heterogeneity throughout the pancreatic body and tail with associated peripancreatic fluid and fat stranding. These findings are consistent with acute on chronic pancreatitis. There are a few low-attenuation pancreatic body lesions, largest measuring 1.1 x 0.8 cm and 1.3 x 1.0 cm (series 2/ image 21). No areas of pancreatic parenchymal nonenhancement or gas. Spleen: Normal size. No mass. Adrenals/Urinary Tract: Normal adrenals. No hydronephrosis. No renal masses. Normal bladder. Stomach/Bowel: Status post distal gastrectomy with gastrojejunostomy. No definite wall thickening in the collapsed remnant proximal stomach. Normal caliber small bowel with no small bowel wall thickening. Normal appendix. Normal large bowel with no diverticulosis, large bowel wall thickening or pericolonic fat stranding. Vascular/Lymphatic: Atherosclerotic nonaneurysmal abdominal aorta. Patent portal, splenic, hepatic  and renal veins. Enlarged 1.7 cm gastrohepatic ligament node (series 2/ image 15). Porta hepatis lymphadenopathy up to 1.5 cm (series  2/image 16). No additional pathologically enlarged abdominopelvic nodes. Reproductive: Normal size prostate. Other: No pneumoperitoneum, ascites or focal intraperitoneal fluid collection. Musculoskeletal: No aggressive appearing focal osseous lesions. Moderate degenerative changes in the visualized thoracolumbar spine. IMPRESSION: 1. Acute on chronic pancreatitis. No evidence of necrotizing pancreatitis. 2. Small low-attenuation lesions in the pancreatic body, favor chronic and/or developing pseudocysts. Recommend attention on a follow-up CT abdomen with IV contrast or MRI abdomen without and with IV contrast in 3 months. 3. Trace layering bilateral pleural effusions. 4. Nonspecific mild upper retroperitoneal lymphadenopathy, which can also be reassessed on follow-up CT or MRI abdomen in 3 months. 5. Cholelithiasis. No evidence of acute cholecystitis. No biliary ductal dilatation. 6. Diffuse hepatic steatosis. Electronically Signed   By: Delbert Phenix M.D.   On: 09/20/2015 18:47   Dg Chest Port 1 View  09/20/2015  CLINICAL DATA:  Hyponatremia.  Alcohol use. EXAM: PORTABLE CHEST 1 VIEW COMPARISON:  None. FINDINGS: Slight interstitial pattern to the lung bases may indicate fibrosis or linear atelectasis. No focal consolidation. No blunting of costophrenic angles. No pneumothorax. Normal heart size and pulmonary vascularity. Mediastinal contours appear intact. IMPRESSION: Slight fibrosis or atelectasis in the lung bases. No evidence of active pulmonary disease. Electronically Signed   By: Burman Nieves M.D.   On: 09/20/2015 05:53   US Abdomen Limited Ruq  09/19/2015  CLINICAL DATA:  Epigastric pain for 2 weeks, vomiting for 3 days. History of ETOH abuse. EXAM: US ABDOMEN LIMITED - RIGHT UPPER QUADRANT COMPARISON:  None. FINDINGS: Gallbladder: Sludge and stones within the mildly distended gallbladder, largest stone measuring 9 mm greatest dimension. No gallbladder wall thickening, pericholecystic fluid or other  secondary signs of acute cholecystitis. Common bile duct: Diameter: Normal at 5 mm. Liver: Liver is diffusely echogenic indicating fatty infiltration. No focal mass or lesion identified within the liver, although the fatty infiltration limits characterization of parenchymal detail. IMPRESSION: 1. Sludge and stones within the gallbladder, largest stone measuring 9 mm. No evidence of acute cholecystitis. 2. No bile duct dilatation. 3. Fatty infiltration of the liver. Electronically Signed   By: Bary Richard M.D.   On: 09/19/2015 20:11    Time Spent in minutes 25   Makenna Macaluso PA-S on 09/23/2015 at 12:35 PM  Between 7am to 7pm - Pager - [phone]  After 7pm go to [email or web address] - password [facility]  [facility] -  Office  [phone]

## 2015-09-24 ENCOUNTER — Other Ambulatory Visit: Payer: Self-pay

## 2015-09-24 DIAGNOSIS — I959 Hypotension, unspecified: Secondary | ICD-10-CM

## 2015-09-24 LAB — BASIC METABOLIC PANEL
ANION GAP: 9 (ref 5–15)
BUN: 8 mg/dL (ref 6–20)
CALCIUM: 7.7 mg/dL — AB (ref 8.9–10.3)
CO2: 29 mmol/L (ref 22–32)
CREATININE: 0.49 mg/dL — AB (ref 0.61–1.24)
Chloride: 85 mmol/L — ABNORMAL LOW (ref 101–111)
GFR calc Af Amer: >60 mL/min (ref >60)
GLUCOSE: 99 mg/dL (ref 65–99)
Potassium: 2.4 mmol/L — CL (ref 3.5–5.1)
Sodium: 123 mmol/L — ABNORMAL LOW (ref 135–145)

## 2015-09-24 LAB — CORTISOL: Cortisol, Plasma: 14.3 ug/dL

## 2015-09-24 LAB — MAGNESIUM: Magnesium: 1.4 mg/dL — ABNORMAL LOW (ref 1.7–2.4)

## 2015-09-24 MED ORDER — POTASSIUM CHLORIDE 10 MEQ/100ML IV SOLN
10.0000 meq | INTRAVENOUS | Status: AC
  Administered 2015-09-24 (×4): 10 meq via INTRAVENOUS
  Filled 2015-09-24 (×4): qty 100

## 2015-09-24 MED ORDER — FAMOTIDINE 20 MG PO TABS
20.0000 mg | ORAL_TABLET | Freq: Two times a day (BID) | ORAL | Status: DC
  Administered 2015-09-24 – 2015-10-03 (×18): 20 mg via ORAL
  Filled 2015-09-24 (×20): qty 1

## 2015-09-24 MED ORDER — SODIUM CHLORIDE 1 G PO TABS
2.0000 g | ORAL_TABLET | Freq: Three times a day (TID) | ORAL | Status: DC
  Administered 2015-09-24 – 2015-09-26 (×8): 2 g via ORAL
  Filled 2015-09-24 (×9): qty 2

## 2015-09-24 MED ORDER — MAGNESIUM SULFATE 4 GM/100ML IV SOLN
4.0000 g | Freq: Once | INTRAVENOUS | Status: AC
  Administered 2015-09-24: 4 g via INTRAVENOUS
  Filled 2015-09-24: qty 100

## 2015-09-24 MED ORDER — MAGNESIUM SULFATE 2 GM/50ML IV SOLN
2.0000 g | Freq: Once | INTRAVENOUS | Status: AC
  Administered 2015-09-24: 2 g via INTRAVENOUS
  Filled 2015-09-24: qty 50

## 2015-09-24 MED ORDER — METOPROLOL TARTRATE 5 MG/5ML IV SOLN: 5.0000 mg | Freq: Once | INTRAVENOUS | Status: DC

## 2015-09-24 MED ORDER — POTASSIUM CHLORIDE 10 MEQ/100ML IV SOLN
10.0000 meq | Freq: Once | INTRAVENOUS | Status: AC
  Administered 2015-09-24: 10 meq via INTRAVENOUS
  Filled 2015-09-24: qty 100

## 2015-09-24 MED ORDER — SODIUM CHLORIDE 0.9 % IV BOLUS (SEPSIS)
1000.0000 mL | Freq: Once | INTRAVENOUS | Status: AC
  Administered 2015-09-24: 1000 mL via INTRAVENOUS

## 2015-09-24 MED ORDER — POTASSIUM CHLORIDE CRYS ER 20 MEQ PO TBCR
40.0000 meq | EXTENDED_RELEASE_TABLET | ORAL | Status: AC
  Administered 2015-09-24 (×2): 40 meq via ORAL
  Filled 2015-09-24 (×2): qty 2

## 2015-09-24 MED ORDER — ALUM & MAG HYDROXIDE-SIMETH 200-200-20 MG/5ML PO SUSP
30.0000 mL | Freq: Four times a day (QID) | ORAL | Status: DC | PRN
  Administered 2015-09-24 – 2015-09-25 (×2): 30 mL via ORAL
  Filled 2015-09-24 (×2): qty 30

## 2015-09-24 NOTE — Progress Notes (Signed)
Patient c/o of felling  full in his abdomen with mild pain. MD notified MD via text. No order given, will continue to monitor patient.

## 2015-09-24 NOTE — Progress Notes (Signed)
PROGRESS NOTE                                                                                                                                                                                                             Patient Demographics:    Shannon Lester, is a 65 y.o. male, DOB - Dec 23, 1950, ZOX:[id number]  Admit date - 09/19/2015   Admitting Physician Lorretta Harp, MD  Outpatient Primary MD for the patient is No primary care provider on file.  LOS - 4  Outpatient Specialists:None   Chief Complaint  Patient presents with  . 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 Denies any vomiting, fevers, chills, hematemesis or melena. Denies any diarrhea or constipation. Reports he may have lost some weight. Denies any sick contact or recent travel. Does not take any medicines or see his doctor.  Patient is a poor historian despite having interpretor present. Patient in [place] visiting friends, lives in [place], [place]. Epigastric pain started 1 month ago, that is constant. Pain worsens with food and therefore has not been eating much.  He has tried Tylenol and aspirin with no relief. He denies ever experiencing an episode like this before. His BM is 3-4x/day loose and appears poorly digested if he eats that day. Denies steatorrhea. He started drinking when he was 65 years old and for the past 30+ years, he has been drinking 3 bottles of beer/day.  In the ED he was found to have severe hypokalemia, hypomagnesemia and hyponatremia and patient transferred to [facility].   Subjective:   Epigastric pain improved.  Hypotensive to 70s this morning. Denies dizziness.   Assessment  & Plan :    Principal Problem: Acute on Chronic Pancreatitis  -Suspect alcohol induced. -Continues to have low electrolytes which is being aggressively replenished. - CT abdomen shows acute on chronic pancreatitis with no evidence of necrotizing pancreatitis. Also shows small low-attenuation lesions in the pancreatic body, favor chronic and/or developing pseudocysts. -Ultrasound abdomen shows sludge and gallbladder stone without Murphy sign or  CBD dilatation. -  Will need pancreatic enzyme supplement on discharge.   Active Problems: Severe Hypokalemia and hypomagnesemia -Continue aggressive replacement.   Hyponatremia - Normal urine sodium with low serum and urine osmolality. Received aggressive hydration on first 3 days sodium still low. Did not improve with fluid restriction. Given his alcohol use beer potomania is another suspicion. Also hypotensive this morning. Will resume hydration and add salt tablets. (Consult nephrology if unimproved.)  Hypotension Given IV normal saline bolus followed by maintenance fluid. A.m. cortisol normal.  Iron deficiency anemia Needs iron supplement upon discharge. Normal B12.  Alcohol abuse Counseled on cessation. No withdrawal symptoms. Continue thiamine, folate and multivitamin.  Tobacco use - Counseled on cessation. Nicotine patch.  Hyperglycemia , Reports polyuria and nocturia. However A1c is 5.6.  Severe protein calorie malnutrition. Added supplement.   Code Status : Full code  Family Communication  : None at bedside  Disposition Plan  : Home once electrolytes improved.  Barriers For Discharge : abnormal electrolytes , hypotension  Consults  :  None  Procedures  :  Ultrasound abdomen CT abdomen and pelvis  DVT Prophylaxis  :  Lovenox -  Lab Results  Component Value Date   PLT 378 09/19/2015    Antibiotics  :    Anti-infectives    None         Objective:   Filed Vitals:   09/24/15 0506 09/24/15 0726 09/24/15 1000 09/24/15 1224  BP: 91/47 76/42 99/52  95/57  Pulse: 69 67 61 77  Temp: 98.6 F (37 C) 98.2 F (36.8 C) 97.6 F (36.4 C) 97.6 F (36.4 C)  TempSrc: Oral Oral Oral Oral  Resp: 16  16 20   Height:      Weight: 50.485 kg (111 lb 4.8 oz)     SpO2: 98% 99% 100% 99%    Wt Readings from Last 3 Encounters:  09/24/15 50.485 kg (111 lb 4.8 oz)     Intake/Output Summary (Last 24 hours) at 09/24/15 1320 Last data filed at 09/24/15 1247  Gross per 24 hour  Intake 7726.67 ml  Output   1950 ml  Net 5776.67 ml     Physical Exam  Gen:No distress HEENT: Moist mucosa, supple neck Chest: clear b/l, no added sounds.  CVS: S1 and S2 normal, no murmurs GI: soft, midline laparotomy scar, nondistended, bowel sounds present. Small 3x3cm ventral hernia adjacent , reducible. Mild epigastric tenderness. Musculoskeletal: warm, no edema     Data Review:    CBC  Recent Labs Lab 09/19/15 1837  WBC 13.9*  HGB 9.6*  HCT 27.0*  PLT 378  MCV 93.1  MCH 33.1  MCHC 35.6  RDW 12.3  LYMPHSABS 1.2  MONOABS 1.5*  EOSABS 0.0  BASOSABS 0.0    Chemistries   Recent Labs Lab 09/19/15 1837 09/19/15 2005 09/20/15 0328  09/20/15 1617 09/21/15 0859 09/22/15 0408 09/23/15 0828 09/24/15 0312  NA 123* 123* 124*  --  124* 125* 125* 122* 123*  K 1.8* 2.0* 2.7*  --  2.8* 3.1* 5.1 3.1* 2.4*  CL 71* 72* 79*  --  81* 84* 90* 89* 85*  CO2 40* 38* 35*  --  35* 31 27 26 29   GLUCOSE 108* 99 132*  --  154* 204* 113* 185* 99  BUN 7 7 <5*  --  <5* <5* <5* 6 8  CREATININE 0.64 0.60* 0.64  --  0.65 0.56* 0.53* 0.53* 0.49*  CALCIUM 7.5* 7.2* 7.1*  --  7.3* 7.6* 8.0* 7.5* 7.7*  MG 0.8*  --   --   < > 2.1 0.9* 1.1* 1.1* 1.4*  AST 21 23 21   --   --  21  --   --   --   ALT 17 17 15*  --   --  14*  --   --   --   ALKPHOS 160* 161* 143*  --   --  144*  --   --   --   BILITOT 1.0 1.3* 0.8  --   --  0.8  --   --   --   < > = values in this  interval not displayed. ------------------------------------------------------------------------------------------------------------------  Recent Labs  09/22/15 0408  TRIG 53    Lab Results  Component Value Date   HGBA1C 5.6 09/20/2015   ------------------------------------------------------------------------------------------------------------------ No results for input(s): TSH, T4TOTAL, T3FREE, THYROIDAB in the last 72 hours.  Invalid input(s): FREET3 ------------------------------------------------------------------------------------------------------------------ No results for input(s): VITAMINB12, FOLATE, FERRITIN, TIBC, IRON, RETICCTPCT in the last 72 hours.  Coagulation profile No results for input(s): INR, PROTIME in the last 168 hours.  No results for input(s): DDIMER in the last 72 hours.  Cardiac Enzymes No results for input(s): CKMB, TROPONINI, MYOGLOBIN in the last 168 hours.  Invalid input(s): CK ------------------------------------------------------------------------------------------------------------------ No results found for: BNP  Inpatient Medications  Scheduled Meds: . enoxaparin (LOVENOX) injection  40 mg Subcutaneous Daily  . feeding supplement  1 Container Oral TID BM  . feeding supplement (PRO-STAT SUGAR FREE 64)  30 mL Oral TID WC  . folic acid  1 mg Oral Daily  . magnesium oxide  400 mg Oral BID  . multivitamin with minerals  1 tablet Oral Daily  . nicotine  14 mg Transdermal Daily  . sodium chloride  2 g Oral TID WC  . thiamine  100 mg Oral Daily   Continuous Infusions:   PRN Meds:.acetaminophen, morphine injection, ondansetron (ZOFRAN) IV  Micro Results No results found for this or any previous visit (from the past 240 hour(s)).  Radiology Reports Ct Abdomen Pelvis W Contrast  09/20/2015  CLINICAL DATA:  Epigastric abdominal pain for 2 weeks. Vomiting. Lower extremity swelling. Inpatient. EXAM: CT ABDOMEN AND PELVIS WITH CONTRAST  TECHNIQUE: Multidetector CT imaging of the abdomen and pelvis was performed using the standard protocol following bolus administration of intravenous contrast. CONTRAST:  80mL ISOVUE-300 IOPAMIDOL (ISOVUE-300) INJECTION 61% COMPARISON:  09/19/2015 abdominal sonogram. FINDINGS: Lower chest: No significant pulmonary nodules or acute consolidative airspace disease. Trace layering bilateral pleural effusions and minimal dependent atelectasis in both lower lobes. Hepatobiliary: Normal size liver. Diffuse hepatic steatosis. No liver mass. No definite liver surface irregularity. Nondistended gallbladder contains a 9 mm calcified gallstone. No gallbladder wall thickening or pericholecystic fat stranding. No biliary ductal dilatation. Common bile duct diameter 5 mm. Pancreas: Coarse calcifications in the pancreatic head. Thickening and parenchymal heterogeneity throughout the pancreatic body and tail with associated peripancreatic fluid and fat stranding. These findings are consistent with acute on chronic pancreatitis. There are a few low-attenuation pancreatic body lesions, largest measuring 1.1 x 0.8 cm and 1.3 x 1.0 cm (series 2/ image 21). No areas of pancreatic parenchymal nonenhancement or gas. Spleen: Normal size. No mass. Adrenals/Urinary Tract: Normal adrenals. No hydronephrosis. No renal masses. Normal bladder. Stomach/Bowel: Status post distal gastrectomy with gastrojejunostomy. No definite wall thickening in the collapsed remnant proximal stomach. Normal caliber small bowel with no small bowel wall thickening. Normal appendix. Normal large bowel with no diverticulosis, large bowel wall thickening or pericolonic fat stranding.  Vascular/Lymphatic: Atherosclerotic nonaneurysmal abdominal aorta. Patent portal, splenic, hepatic and renal veins. Enlarged 1.7 cm gastrohepatic ligament node (series 2/ image 15). Porta hepatis lymphadenopathy up to 1.5 cm (series 2/image 16). No additional pathologically enlarged  abdominopelvic nodes. Reproductive: Normal size prostate. Other: No pneumoperitoneum, ascites or focal intraperitoneal fluid collection. Musculoskeletal: No aggressive appearing focal osseous lesions. Moderate degenerative changes in the visualized thoracolumbar spine. IMPRESSION: 1. Acute on chronic pancreatitis. No evidence of necrotizing pancreatitis. 2. Small low-attenuation lesions in the pancreatic body, favor chronic and/or developing pseudocysts. Recommend attention on a follow-up CT abdomen with IV contrast or MRI abdomen without and with IV contrast in 3 months. 3. Trace layering bilateral pleural effusions. 4. Nonspecific mild upper retroperitoneal lymphadenopathy, which can also be reassessed on follow-up CT or MRI abdomen in 3 months. 5. Cholelithiasis. No evidence of acute cholecystitis. No biliary ductal dilatation. 6. Diffuse hepatic steatosis. Electronically Signed   By: Delbert Phenix M.D.   On: 09/20/2015 18:47   Dg Chest Port 1 View  09/20/2015  CLINICAL DATA:  Hyponatremia.  Alcohol use. EXAM: PORTABLE CHEST 1 VIEW COMPARISON:  None. FINDINGS: Slight interstitial pattern to the lung bases may indicate fibrosis or linear atelectasis. No focal consolidation. No blunting of costophrenic angles. No pneumothorax. Normal heart size and pulmonary vascularity. Mediastinal contours appear intact. IMPRESSION: Slight fibrosis or atelectasis in the lung bases. No evidence of active pulmonary disease. Electronically Signed   By: Burman Nieves M.D.   On: 09/20/2015 05:53   US Abdomen Limited Ruq  09/19/2015  CLINICAL DATA:  Epigastric pain for 2 weeks, vomiting for 3 days. History of ETOH abuse. EXAM: US ABDOMEN LIMITED - RIGHT UPPER QUADRANT COMPARISON:  None. FINDINGS: Gallbladder: Sludge and stones within the mildly distended gallbladder, largest stone measuring 9 mm greatest dimension. No gallbladder wall thickening, pericholecystic fluid or other secondary signs of acute cholecystitis. Common bile  duct: Diameter: Normal at 5 mm. Liver: Liver is diffusely echogenic indicating fatty infiltration. No focal mass or lesion identified within the liver, although the fatty infiltration limits characterization of parenchymal detail. IMPRESSION: 1. Sludge and stones within the gallbladder, largest stone measuring 9 mm. No evidence of acute cholecystitis. 2. No bile duct dilatation. 3. Fatty infiltration of the liver. Electronically Signed   By: Bary Richard M.D.   On: 09/19/2015 20:11    Time Spent in minutes 25   Jhamari Markowicz PA-S on 09/24/2015 at 1:20 PM  Between 7am to 7pm - Pager - [phone]  After 7pm go to [email or web address] - password [facility]  [facility] -  Office  [phone]

## 2015-09-24 NOTE — Progress Notes (Signed)
This am patient BP was low 76/42, was asymptomatic at the time, MD notified, 1 litter NS bolus given per order, pt. Tolerate well BP after bolus 99/52. Will continue to monitor patient.

## 2015-09-25 DIAGNOSIS — D519 Vitamin B12 deficiency anemia, unspecified: Secondary | ICD-10-CM

## 2015-09-25 DIAGNOSIS — D509 Iron deficiency anemia, unspecified: Secondary | ICD-10-CM

## 2015-09-25 LAB — BASIC METABOLIC PANEL
Anion gap: 9 (ref 5–15)
BUN: 7 mg/dL (ref 6–20)
CHLORIDE: 89 mmol/L — AB (ref 101–111)
CO2: 27 mmol/L (ref 22–32)
CREATININE: 0.5 mg/dL — AB (ref 0.61–1.24)
Calcium: 7.8 mg/dL — ABNORMAL LOW (ref 8.9–10.3)
GFR calc Af Amer: >60 mL/min (ref >60)
GFR calc non Af Amer: >60 mL/min (ref >60)
Glucose, Bld: 105 mg/dL — ABNORMAL HIGH (ref 65–99)
Potassium: 2.8 mmol/L — ABNORMAL LOW (ref 3.5–5.1)
Sodium: 125 mmol/L — ABNORMAL LOW (ref 135–145)

## 2015-09-25 LAB — MAGNESIUM: Magnesium: 1.4 mg/dL — ABNORMAL LOW (ref 1.7–2.4)

## 2015-09-25 MED ORDER — FERROUS SULFATE 325 (65 FE) MG PO TABS
325.0000 mg | ORAL_TABLET | Freq: Two times a day (BID) | ORAL | Status: DC
Start: 2015-09-25 — End: 2015-09-26
  Administered 2015-09-25 – 2015-09-26 (×3): 325 mg via ORAL
  Filled 2015-09-25 (×3): qty 1

## 2015-09-25 MED ORDER — MAGNESIUM OXIDE 400 (241.3 MG) MG PO TABS
800.0000 mg | ORAL_TABLET | Freq: Two times a day (BID) | ORAL | Status: AC
  Administered 2015-09-25 – 2015-09-26 (×3): 800 mg via ORAL
  Filled 2015-09-25 (×3): qty 2

## 2015-09-25 MED ORDER — POTASSIUM CHLORIDE 2 MEQ/ML IV SOLN
INTRAVENOUS | Status: DC
  Administered 2015-09-25 (×2): via INTRAVENOUS
  Filled 2015-09-25 (×6): qty 1000

## 2015-09-25 MED ORDER — MAGNESIUM SULFATE 4 GM/100ML IV SOLN
4.0000 g | Freq: Once | INTRAVENOUS | Status: AC
  Administered 2015-09-25: 4 g via INTRAVENOUS
  Filled 2015-09-25: qty 100

## 2015-09-25 MED ORDER — VITAMIN B-12 1000 MCG PO TABS
1000.0000 ug | ORAL_TABLET | Freq: Every day | ORAL | Status: DC
  Administered 2015-09-25 – 2015-10-03 (×9): 1000 ug via ORAL
  Filled 2015-09-25 (×9): qty 1

## 2015-09-25 MED ORDER — POTASSIUM CHLORIDE CRYS ER 20 MEQ PO TBCR
40.0000 meq | EXTENDED_RELEASE_TABLET | ORAL | Status: AC
  Administered 2015-09-25 (×2): 40 meq via ORAL
  Filled 2015-09-25 (×2): qty 2

## 2015-09-25 MED ORDER — POTASSIUM CHLORIDE 20 MEQ PO PACK
60.0000 meq | PACK | Freq: Every day | ORAL | Status: DC
  Administered 2015-09-25 – 2015-09-26 (×4): 60 meq via ORAL
  Filled 2015-09-25 (×5): qty 3

## 2015-09-25 NOTE — Progress Notes (Signed)
PROGRESS NOTE                                                                                                                                                                                                             Patient Demographics:    Shannon MuirServando Lester, is a 65 y.o. male, DOB - 1951/01/31, ZOX:[id number]  Admit date - 09/19/2015   Admitting Physician Lorretta HarpXilin Niu, MD  Outpatient Primary MD for the patient is No primary care provider on file.  LOS - 5  Outpatient Specialists:None   Chief Complaint  Patient presents with  . Abdominal Pain       Brief Narrative   65 year old Hispanic male with history of alcohol abuse, laparotomy done 3 years back in [place] (as per patient had part of his stomach removed) presented [facility] with 2 weeks of gradual dull aching epigastric abdominal pain constant with poor appetite (reports symptoms getting worse with eating) and some nausea. Denies any vomiting, fevers, chills, hematemesis or melena. Denies any diarrhea or constipation. Reports he may have lost some weight. Denies any sick contact or recent travel. Does not take any medicines or see his doctor.  Patient is a poor historian despite having interpretor present. Patient in [place] visiting friends, lives in [place], [place]. Epigastric pain started 1 month ago, that is constant. Pain worsens with food and therefore has not been eating much.  He has tried Tylenol and aspirin with no relief. He denies ever experiencing an episode like this before. His BM is 3-4x/day loose and appears poorly digested if he eats that day. Denies steatorrhea. He started drinking when he was 65 years old and for the past 30+ years, he has been drinking 3 bottles of beer/day.  In the ED he was found to have severe hypokalemia, hypomagnesemia and hyponatremia and patient [facility].   Subjective:   Off and on epigastric pain. BP  low yesterday, given IV bolus.    Assessment  & Plan :    Principal Problem: Acute on Chronic Pancreatitis  - Suspect alcohol induced. - Continues to have low electrolytes which is being aggressively replenished. - CT abdomen shows acute on chronic pancreatitis with no evidence of necrotizing pancreatitis. Also shows small low-attenuation lesions in the pancreatic body, favor chronic and/or developing pseudocysts. - Ultrasound abdomen shows sludge and gallbladder  stone without Murphy sign or CBD dilatation. -  Will need pancreatic enzyme supplement on discharge.  Active Problems: Severe Hypokalemia and hypomagnesemia - Continue to have hyokalemia and hypomagnesemia despite replenishment.  - Continue aggressive replacement. Increased dose of mag-oxide.monitor on tele.( has frequent PVCs and tachy to 120s on minimal exertion)   Hyponatremia Suspect dehydration and ? Beer potomania. Appreciate renal evaluation. Recommends aggressive hydration and replace electrolytes. Added salt tablets on 6/18.  Hypotension maintenance fluids.am cortisol normal.  Iron deficiency anemia Added iron supplement upon discharge. Low normal b12. Add supplement  Alcohol abuse Counseled on cessation. No withdrawal symptoms. Continue thiamine, folate and multivitamin.  Tobacco use - Counseled on cessation. Nicotine patch.  Hyperglycemia Reports polyuria and nocturia. However A1c is 5.6.  Severe protein calorie malnutrition. Added supplement.   Code Status : Full code  Family Communication  : None at bedside  Disposition Plan  : Home once electrolytes improved.  Barriers For Discharge : abnormal electrolytes , hypotension  Consults  :  None  Procedures  :  Ultrasound abdomen CT abdomen and pelvis  DVT Prophylaxis  :  Lovenox -  Lab Results  Component Value Date   PLT 378 09/19/2015    Antibiotics  :    Anti-infectives    None        Objective:   Filed Vitals:   09/24/15 1607  09/24/15 2024 09/25/15 0524 09/25/15 0900  BP: 120/65 104/62 94/45 96/54   Pulse: 87  81 70  Temp:  100.6 F (38.1 C) 98.9 F (37.2 C) 98.2 F (36.8 C)  TempSrc:  Oral Oral Oral  Resp:  16 14 16   Height:      Weight:   51.211 kg (112 lb 14.4 oz)   SpO2:  98% 100% 100%    Wt Readings from Last 3 Encounters:  09/25/15 51.211 kg (112 lb 14.4 oz)     Intake/Output Summary (Last 24 hours) at 09/25/15 0951 Last data filed at 09/25/15 0836  Gross per 24 hour  Intake   1098 ml  Output   2152 ml  Net  -1054 ml     Physical Exam  Gen:No distress HEENT: Moist mucosa, supple neck Chest: clear b/l, no added sounds.  CVS: S1 and S2 normal, no murmurs GI: soft, midline laparotomy scar, nondistended, bowel sounds present. Small 3x3cm ventral hernia adjacent , reducible. Mild epigastric tenderness. Musculoskeletal: warm, no edema     Data Review:    CBC  Recent Labs Lab 09/19/15 1837  WBC 13.9*  HGB 9.6*  HCT 27.0*  PLT 378  MCV 93.1  MCH 33.1  MCHC 35.6  RDW 12.3  LYMPHSABS 1.2  MONOABS 1.5*  EOSABS 0.0  BASOSABS 0.0    Chemistries   Recent Labs Lab 09/19/15 1837 09/19/15 2005 09/20/15 0328  09/21/15 0859 09/22/15 0408 09/23/15 0828 09/24/15 0312 09/25/15 0328  NA 123* 123* 124*  < > 125* 125* 122* 123* 125*  K 1.8* 2.0* 2.7*  < > 3.1* 5.1 3.1* 2.4* 2.8*  CL 71* 72* 79*  < > 84* 90* 89* 85* 89*  CO2 40* 38* 35*  < > 31 27 26 29 27   GLUCOSE 108* 99 132*  < > 204* 113* 185* 99 105*  BUN 7 7 <5*  < > <5* <5* 6 8 7   CREATININE 0.64 0.60* 0.64  < > 0.56* 0.53* 0.53* 0.49* 0.50*  CALCIUM 7.5* 7.2* 7.1*  < > 7.6* 8.0* 7.5* 7.7* 7.8*  MG 0.8*  --   --   < >  0.9* 1.1* 1.1* 1.4* 1.4*  AST 21 23 21   --  21  --   --   --   --   ALT 17 17 15*  --  14*  --   --   --   --   ALKPHOS 160* 161* 143*  --  144*  --   --   --   --   BILITOT 1.0 1.3* 0.8  --  0.8  --   --   --   --   < > = values in this interval not  displayed. ------------------------------------------------------------------------------------------------------------------ No results for input(s): CHOL, HDL, LDLCALC, TRIG, CHOLHDL, LDLDIRECT in the last 72 hours.  Lab Results  Component Value Date   HGBA1C 5.6 09/20/2015   ------------------------------------------------------------------------------------------------------------------ No results for input(s): TSH, T4TOTAL, T3FREE, THYROIDAB in the last 72 hours.  Invalid input(s): FREET3 ------------------------------------------------------------------------------------------------------------------ No results for input(s): VITAMINB12, FOLATE, FERRITIN, TIBC, IRON, RETICCTPCT in the last 72 hours.  Coagulation profile No results for input(s): INR, PROTIME in the last 168 hours.  No results for input(s): DDIMER in the last 72 hours.  Cardiac Enzymes No results for input(s): CKMB, TROPONINI, MYOGLOBIN in the last 168 hours.  Invalid input(s): CK ------------------------------------------------------------------------------------------------------------------ No results found for: BNP  Inpatient Medications  Scheduled Meds: . enoxaparin (LOVENOX) injection  40 mg Subcutaneous Daily  . famotidine  20 mg Oral BID  . feeding supplement  1 Container Oral TID BM  . feeding supplement (PRO-STAT SUGAR FREE 64)  30 mL Oral TID WC  . folic acid  1 mg Oral Daily  . magnesium oxide  800 mg Oral BID  . magnesium sulfate 1 - 4 g bolus IVPB  4 g Intravenous Once  . multivitamin with minerals  1 tablet Oral Daily  . nicotine  14 mg Transdermal Daily  . potassium chloride  40 mEq Oral Q4H  . sodium chloride  2 g Oral TID WC  . thiamine  100 mg Oral Daily   Continuous Infusions: . sodium chloride 0.9 % 1,000 mL with potassium chloride 40 mEq infusion     PRN Meds:.acetaminophen, alum & mag hydroxide-simeth, morphine injection, ondansetron (ZOFRAN) IV  Micro Results No results  found for this or any previous visit (from the past 240 hour(s)).  Radiology Reports Ct Abdomen Pelvis W Contrast  09/20/2015  CLINICAL DATA:  Epigastric abdominal pain for 2 weeks. Vomiting. Lower extremity swelling. Inpatient. EXAM: CT ABDOMEN AND PELVIS WITH CONTRAST TECHNIQUE: Multidetector CT imaging of the abdomen and pelvis was performed using the standard protocol following bolus administration of intravenous contrast. CONTRAST:  80mL ISOVUE-300 IOPAMIDOL (ISOVUE-300) INJECTION 61% COMPARISON:  09/19/2015 abdominal sonogram. FINDINGS: Lower chest: No significant pulmonary nodules or acute consolidative airspace disease. Trace layering bilateral pleural effusions and minimal dependent atelectasis in both lower lobes. Hepatobiliary: Normal size liver. Diffuse hepatic steatosis. No liver mass. No definite liver surface irregularity. Nondistended gallbladder contains a 9 mm calcified gallstone. No gallbladder wall thickening or pericholecystic fat stranding. No biliary ductal dilatation. Common bile duct diameter 5 mm. Pancreas: Coarse calcifications in the pancreatic head. Thickening and parenchymal heterogeneity throughout the pancreatic body and tail with associated peripancreatic fluid and fat stranding. These findings are consistent with acute on chronic pancreatitis. There are a few low-attenuation pancreatic body lesions, largest measuring 1.1 x 0.8 cm and 1.3 x 1.0 cm (series 2/ image 21). No areas of pancreatic parenchymal nonenhancement or gas. Spleen: Normal size. No mass. Adrenals/Urinary Tract: Normal adrenals. No hydronephrosis. No renal masses. Normal  bladder. Stomach/Bowel: Status post distal gastrectomy with gastrojejunostomy. No definite wall thickening in the collapsed remnant proximal stomach. Normal caliber small bowel with no small bowel wall thickening. Normal appendix. Normal large bowel with no diverticulosis, large bowel wall thickening or pericolonic fat stranding.  Vascular/Lymphatic: Atherosclerotic nonaneurysmal abdominal aorta. Patent portal, splenic, hepatic and renal veins. Enlarged 1.7 cm gastrohepatic ligament node (series 2/ image 15). Porta hepatis lymphadenopathy up to 1.5 cm (series 2/image 16). No additional pathologically enlarged abdominopelvic nodes. Reproductive: Normal size prostate. Other: No pneumoperitoneum, ascites or focal intraperitoneal fluid collection. Musculoskeletal: No aggressive appearing focal osseous lesions. Moderate degenerative changes in the visualized thoracolumbar spine. IMPRESSION: 1. Acute on chronic pancreatitis. No evidence of necrotizing pancreatitis. 2. Small low-attenuation lesions in the pancreatic body, favor chronic and/or developing pseudocysts. Recommend attention on a follow-up CT abdomen with IV contrast or MRI abdomen without and with IV contrast in 3 months. 3. Trace layering bilateral pleural effusions. 4. Nonspecific mild upper retroperitoneal lymphadenopathy, which can also be reassessed on follow-up CT or MRI abdomen in 3 months. 5. Cholelithiasis. No evidence of acute cholecystitis. No biliary ductal dilatation. 6. Diffuse hepatic steatosis. Electronically Signed   By: Delbert Phenix M.D.   On: 09/20/2015 18:47   Dg Chest Port 1 View  09/20/2015  CLINICAL DATA:  Hyponatremia.  Alcohol use. EXAM: PORTABLE CHEST 1 VIEW COMPARISON:  None. FINDINGS: Slight interstitial pattern to the lung bases may indicate fibrosis or linear atelectasis. No focal consolidation. No blunting of costophrenic angles. No pneumothorax. Normal heart size and pulmonary vascularity. Mediastinal contours appear intact. IMPRESSION: Slight fibrosis or atelectasis in the lung bases. No evidence of active pulmonary disease. Electronically Signed   By: Burman Nieves M.D.   On: 09/20/2015 05:53   US Abdomen Limited Ruq  09/19/2015  CLINICAL DATA:  Epigastric pain for 2 weeks, vomiting for 3 days. History of ETOH abuse. EXAM: US ABDOMEN LIMITED -  RIGHT UPPER QUADRANT COMPARISON:  None. FINDINGS: Gallbladder: Sludge and stones within the mildly distended gallbladder, largest stone measuring 9 mm greatest dimension. No gallbladder wall thickening, pericholecystic fluid or other secondary signs of acute cholecystitis. Common bile duct: Diameter: Normal at 5 mm. Liver: Liver is diffusely echogenic indicating fatty infiltration. No focal mass or lesion identified within the liver, although the fatty infiltration limits characterization of parenchymal detail. IMPRESSION: 1. Sludge and stones within the gallbladder, largest stone measuring 9 mm. No evidence of acute cholecystitis. 2. No bile duct dilatation. 3. Fatty infiltration of the liver. Electronically Signed   By: Bary Richard M.D.   On: 09/19/2015 20:11    Time Spent in minutes 25   Amy Yu PA-S on 09/25/2015 at 9:51 AM  Between 7am to 7pm - Pager - [phone]  After 7pm go to [email or web address] - password [facility]  [facility] -  Office  [phone]

## 2015-09-25 NOTE — Consult Note (Signed)
HPI: Shannon Lester is a 65 y.o. male with medical history significant of unknown abdominal surgery in the past. Patient presents to the ED at [facility] with c/o 2 week history of gradual onset, constant, non-radiating, burning epigastric and abdominal pain. Patient has now had 3 day history of associated leg swelling and vomiting. Eating makes symptoms worse. No fever, CP, constipation, diarrhea, SOB. He was found to have metabolic derangement including hyponatremia(121), hypokalemia (1.8) and hypomagnesemia (0.8). He has a hx of alcohol abuse.  He consume 3--- 25 ounce cans of beer daily.  He has evidence of acute or chronic pancreatitis by CT and  is status post distal gastrectomy with gastrojejunostomy.     Past Medical History  Diagnosis Date  . ETOH abuse    History reviewed. No pertinent past surgical history. Social History:  reports that he has been smoking Cigarettes.  He has been smoking about 0.50 packs per day. He does not have any smokeless tobacco history on file. He reports that he drinks about 3.6 oz of alcohol per week. He reports that he does not use illicit drugs. Allergies: No Known Allergies History reviewed. No pertinent family history.  Medications:  Scheduled: . enoxaparin (LOVENOX) injection  40 mg Subcutaneous Daily  . famotidine  20 mg Oral BID  . feeding supplement  1 Container Oral TID BM  . feeding supplement (PRO-STAT SUGAR FREE 64)  30 mL Oral TID WC  . folic acid  1 mg Oral Daily  . magnesium oxide  800 mg Oral BID  . multivitamin with minerals  1 tablet Oral Daily  . nicotine  14 mg Transdermal Daily  . sodium chloride  2 g Oral TID WC  . thiamine  100 mg Oral Daily     ROS: as per HPI IBlood pressure 96/54, pulse 70, temperature 98.2 F (36.8 C), temperature source Oral, resp. rate 16, height _0  (1.676 m), weight 51.211 kg (112 lb 14.4 oz), SpO2 100 %.  General appearance: alert and cooperative Head: Normocephalic, without obvious abnormality,  atraumatic Eyes: negative Ears: normal TM's and external ear canals both ears Nose: Nares normal. Septum midline. Mucosa normal. No drainage or sinus tenderness. Throat: lips, mucosa, and tongue normal; teeth and gums normal Resp: clear to auscultation bilaterally Chest wall: no tenderness Cardio: regular rate and rhythm, S1, S2 normal, no murmur, click, rub or gallop GI: midline surgical scar and 5x3 cm left incisional hernia that I reduced Extremities: extremities normal, atraumatic, no cyanosis or edema Skin: Skin color, texture, turgor normal. No rashes or lesions Neurologic: Grossly normal Results for orders placed or performed during the hospital encounter of 09/19/15 (from the past 48 hour(s))  Basic metabolic panel     Status: Abnormal   Collection Time: 09/24/15  3:12 AM  Result Value Ref Range   Sodium 123 (L) 135 - 145 mmol/L   Potassium 2.4 (LL) 3.5 - 5.1 mmol/L    Comment: CRITICAL RESULT CALLED TO, READ BACK BY AND VERIFIED WITH: YOCUM,R RN 09/24/2015 0448 JORDANS    Chloride 85 (L) 101 - 111 mmol/L   CO2 29 22 - 32 mmol/L   Glucose, Bld 99 65 - 99 mg/dL   BUN 8 6 - 20 mg/dL   Creatinine, Ser 0.49 (L) 0.61 - 1.24 mg/dL   Calcium 7.7 (L) 8.9 - 10.3 mg/dL   GFR calc non Af Amer >60 >60 mL/min   GFR calc Af Amer >60 >60 mL/min    Comment: (NOTE) The eGFR has been  calculated using the CKD EPI equation. This calculation has not been validated in all clinical situations. eGFR's persistently <60 mL/min signify possible Chronic Kidney Disease.    Anion gap 9 5 - 15  Magnesium     Status: Abnormal   Collection Time: 09/24/15  3:12 AM  Result Value Ref Range   Magnesium 1.4 (L) 1.7 - 2.4 mg/dL  Cortisol     Status: None   Collection Time: 09/24/15  7:27 AM  Result Value Ref Range   Cortisol, Plasma 14.3 ug/dL    Comment: (NOTE) AM    6.7 - 22.6 ug/dL PM   <10.0       ug/dL   Basic metabolic panel     Status: Abnormal   Collection Time: 09/25/15  3:28 AM  Result  Value Ref Range   Sodium 125 (L) 135 - 145 mmol/L   Potassium 2.8 (L) 3.5 - 5.1 mmol/L   Chloride 89 (L) 101 - 111 mmol/L   CO2 27 22 - 32 mmol/L   Glucose, Bld 105 (H) 65 - 99 mg/dL   BUN 7 6 - 20 mg/dL   Creatinine, Ser 0.50 (L) 0.61 - 1.24 mg/dL   Calcium 7.8 (L) 8.9 - 10.3 mg/dL   GFR calc non Af Amer >60 >60 mL/min   GFR calc Af Amer >60 >60 mL/min    Comment: (NOTE) The eGFR has been calculated using the CKD EPI equation. This calculation has not been validated in all clinical situations. eGFR's persistently <60 mL/min signify possible Chronic Kidney Disease.    Anion gap 9 5 - 15  Magnesium     Status: Abnormal   Collection Time: 09/25/15  3:28 AM  Result Value Ref Range   Magnesium 1.4 (L) 1.7 - 2.4 mg/dL   No results found.  Assessment:  1 Metabolic derangement: hyponatremia, hypomagnesemia, hypokalemia I suspect nutritional(Beer Drinkers Potomania) with severe body deficits, perhaps exacerbated by prior partial gastrectomy and gastrojejunostomy 2 Chronic alcoholism 3 Abdominal pain, ? Multiple causes 4 Incisional hernia, reducible  5 Hx of distal gastrectomy and gastrojejunostomy 6 Pancreatitis by CT scan  Plan: 1 Aggressive electrolyte repletion, mag, K, Na 2 Reduce alcohol consumption 3 Ask surgery to eval incisional hernia  4 Check phos  Fernand Sorbello C 09/25/2015, 1:43 PM

## 2015-09-25 NOTE — Care Management Important Message (Signed)
Important Message  Patient Details  Name: Rance MuirServando Kimura MRN: [id number] Date of Birth: 1950-08-09   Medicare Important Message Given:  Yes    Bernadette HoitShoffner, Omarr Hann Coleman 09/25/2015, 10:11 AM

## 2015-09-26 LAB — MAGNESIUM: MAGNESIUM: 1.6 mg/dL — AB (ref 1.7–2.4)

## 2015-09-26 LAB — RENAL FUNCTION PANEL
ALBUMIN: 1.5 g/dL — AB (ref 3.5–5.0)
ANION GAP: 5 (ref 5–15)
BUN: 7 mg/dL (ref 6–20)
CALCIUM: 8.2 mg/dL — AB (ref 8.9–10.3)
CO2: 25 mmol/L (ref 22–32)
Chloride: 97 mmol/L — ABNORMAL LOW (ref 101–111)
Creatinine, Ser: 0.51 mg/dL — ABNORMAL LOW (ref 0.61–1.24)
GLUCOSE: 101 mg/dL — AB (ref 65–99)
POTASSIUM: 5.1 mmol/L (ref 3.5–5.1)
Phosphorus: 2.7 mg/dL (ref 2.5–4.6)
SODIUM: 127 mmol/L — AB (ref 135–145)

## 2015-09-26 MED ORDER — SODIUM CHLORIDE 1 G PO TABS
2.0000 g | ORAL_TABLET | Freq: Three times a day (TID) | ORAL | Status: DC
  Administered 2015-09-27 – 2015-10-03 (×19): 2 g via ORAL
  Filled 2015-09-26 (×22): qty 2

## 2015-09-26 MED ORDER — SODIUM CHLORIDE 0.9 % IV SOLN
INTRAVENOUS | Status: DC
Start: 2015-09-26 — End: 2015-09-28
  Administered 2015-09-26 (×2): via INTRAVENOUS

## 2015-09-26 MED ORDER — FERROUS SULFATE 325 (65 FE) MG PO TABS
325.0000 mg | ORAL_TABLET | Freq: Two times a day (BID) | ORAL | Status: DC
  Administered 2015-09-27 – 2015-10-03 (×13): 325 mg via ORAL
  Filled 2015-09-26 (×14): qty 1

## 2015-09-26 MED ORDER — ENSURE ENLIVE PO LIQD
237.0000 mL | Freq: Two times a day (BID) | ORAL | Status: DC
  Administered 2015-09-27 – 2015-10-03 (×11): 237 mL via ORAL

## 2015-09-26 MED ORDER — BOOST / RESOURCE BREEZE PO LIQD
1.0000 | ORAL | Status: DC
Start: 2015-09-27 — End: 2015-10-03
  Administered 2015-09-30 – 2015-10-02 (×3): 1 via ORAL

## 2015-09-26 MED ORDER — MAGNESIUM SULFATE 4 GM/100ML IV SOLN
4.0000 g | Freq: Once | INTRAVENOUS | Status: AC
  Administered 2015-09-26: 4 g via INTRAVENOUS
  Filled 2015-09-26: qty 100

## 2015-09-26 MED ORDER — PRO-STAT SUGAR FREE PO LIQD
30.0000 mL | Freq: Three times a day (TID) | ORAL | Status: DC
  Administered 2015-09-27 – 2015-10-02 (×9): 30 mL via ORAL
  Filled 2015-09-26 (×6): qty 30

## 2015-09-26 NOTE — Progress Notes (Signed)
PROGRESS NOTE                                                                                                                                                                                                             Patient Demographics:    Shannon Lester, is a 65 y.o. male, DOB - 01/18/1951, ZOX:[id number]  Admit date - 09/19/2015   Admitting Physician Lorretta Harp, MD  Outpatient Primary MD for the patient is No primary care provider on file.  LOS - 6  Outpatient Specialists:None   Chief Complaint  Patient presents with  . Abdominal Pain       Brief Narrative   65 year old Hispanic male with history of alcohol abuse, laparotomy done 3 years back in [place] (as per patient had part of his stomach removed) presented at [facility] with 2 weeks of gradual dull aching epigastric abdominal pain constant with poor appetite (reports symptoms getting worse with eating) and some nausea. Denies any vomiting, fevers, chills, hematemesis or melena. Denies any diarrhea or constipation. Reports he may have lost some weight. Denies any sick contact or recent travel. Does not take any medicines or see his doctor.  Patient is a poor historian despite having interpretor present. Patient in [place] visiting friends, lives in [place], [place]. Epigastric pain started 1 month ago, that is constant. Pain worsens with food and therefore has not been eating much.  He has tried Tylenol and aspirin with no relief. He denies ever experiencing an episode like this before. His BM is 3-4x/day loose and appears poorly digested if he eats that day. Denies steatorrhea. He started drinking when he was 65 years old and for the past 30+ years, he has been drinking 3 bottles of beer/day.  In the ED he was found to have severe hypokalemia, hypomagnesemia and hyponatremia and patient transferred to [facility]   Subjective:   Off and on epigastric pain.    Assessment  & Plan :    Principal Problem: Acute on Chronic Pancreatitis  - Suspect alcohol induced. - Electrolytes improving although still low, continue replenishment  - CT abdomen shows acute on chronic pancreatitis with no evidence of necrotizing pancreatitis. Also shows small low-attenuation lesions in the pancreatic body, favor chronic and/or developing pseudocysts. - Ultrasound abdomen shows sludge and gallbladder stone without Murphy sign or CBD dilatation. -  Will need pancreatic enzyme supplement on discharge.  Active Problems: Severe Hypokalemia and hypomagnesemia - Potassium levels normalized. Still with hypomagnesemia, continue to replenish. (has been getting about 4 gm IV mg along with po supplement every day since admission ) - Continue to monitor on tele.( has frequent PVCs and tachy to 120s on minimal exertion) - Echo for continued tachycardia  on minimal exertion   Hyponatremia Suspect dehydration and ? Beer potomania. Appreciate renal evaluation. Recommends aggressive hydration and replace electrolytes. Added salt tablets on 6/18. Sodium level improving.   Hypotension Continue maintenance fluids.am cortisol normal.  Iron deficiency anemia Continue iron and B12 supplement. Will need to continue supplementation upon discharge.   Alcohol abuse Counseled on cessation. No withdrawal symptoms. Continue thiamine, folate and multivitamin.  Tobacco use - Counseled on cessation. Nicotine patch.  Hyperglycemia Reports polyuria and nocturia. However A1c is 5.6.  Severe protein calorie malnutrition. Added supplement.  Incisional hernia Reducible, nontender   Code Status : Full code  Family Communication  : None at bedside  Disposition Plan  : Home once electrolytes improved. Lives in [place]  Barriers For Discharge : abnormal electrolytes , hypotension  Consults  :  None  Procedures  :  Ultrasound abdomen CT abdomen and pelvis  DVT Prophylaxis  :  Lovenox  -  Lab Results  Component Value Date   PLT 378 09/19/2015    Antibiotics  :    Anti-infectives    None        Objective:   Filed Vitals:   09/25/15 0900 09/25/15 2020 09/26/15 0457 09/26/15 1054  BP: 96/54 111/61 102/48 105/58  Pulse: 70 79 72 68  Temp: 98.2 F (36.8 C) 100.1 F (37.8 C) 99.2 F (37.3 C) 98.6 F (37 C)  TempSrc: Oral Oral Oral Oral  Resp: 16 18 18 16   Height:      Weight:   51.71 kg (114 lb)   SpO2: 100% 99% 99% 100%    Wt Readings from Last 3 Encounters:  09/26/15 51.71 kg (114 lb)     Intake/Output Summary (Last 24 hours) at 09/26/15 1055 Last data filed at 09/26/15 1048  Gross per 24 hour  Intake   2875 ml  Output   2350 ml  Net    525 ml     Physical Exam  Gen:No distress HEENT: Moist mucosa, supple neck Chest: clear b/l, no added sounds.  CVS: S1 and S2 normal, no murmurs GI: soft, midline laparotomy scar, nondistended, bowel sounds present. Small 3x3cm ventral hernia adjacent , reducible. Mild epigastric tenderness. Musculoskeletal: warm, no edema     Data Review:    CBC  Recent Labs Lab 09/19/15 1837  WBC 13.9*  HGB 9.6*  HCT 27.0*  PLT 378  MCV 93.1  MCH 33.1  MCHC 35.6  RDW 12.3  LYMPHSABS 1.2  MONOABS 1.5*  EOSABS 0.0  BASOSABS 0.0    Chemistries   Recent Labs Lab 09/19/15 1837 09/19/15 2005 09/20/15 0328  09/21/15 0859 09/22/15 0408 09/23/15 0828 09/24/15 0312 09/25/15 0328 09/26/15 0233  NA 123* 123* 124*  < > 125* 125* 122* 123* 125* 127*  K 1.8* 2.0* 2.7*  < > 3.1* 5.1 3.1* 2.4* 2.8* 5.1  CL 71* 72* 79*  < > 84* 90* 89* 85* 89* 97*  CO2 40* 38* 35*  < > 31 27 26 29 27 25   GLUCOSE 108* 99 132*  < > 204* 113* 185* 99 105* 101*  BUN 7 7 <5*  < > <  5* <5* 6 8 7 7   CREATININE 0.64 0.60* 0.64  < > 0.56* 0.53* 0.53* 0.49* 0.50* 0.51*  CALCIUM 7.5* 7.2* 7.1*  < > 7.6* 8.0* 7.5* 7.7* 7.8* 8.2*  MG 0.8*  --   --   < > 0.9* 1.1* 1.1* 1.4* 1.4* 1.6*  AST 21 23 21   --  21  --   --   --   --   --     ALT 17 17 15*  --  14*  --   --   --   --   --   ALKPHOS 160* 161* 143*  --  144*  --   --   --   --   --   BILITOT 1.0 1.3* 0.8  --  0.8  --   --   --   --   --   < > = values in this interval not displayed. ------------------------------------------------------------------------------------------------------------------ No results for input(s): CHOL, HDL, LDLCALC, TRIG, CHOLHDL, LDLDIRECT in the last 72 hours.  Lab Results  Component Value Date   HGBA1C 5.6 09/20/2015   ------------------------------------------------------------------------------------------------------------------ No results for input(s): TSH, T4TOTAL, T3FREE, THYROIDAB in the last 72 hours.  Invalid input(s): FREET3 ------------------------------------------------------------------------------------------------------------------ No results for input(s): VITAMINB12, FOLATE, FERRITIN, TIBC, IRON, RETICCTPCT in the last 72 hours.  Coagulation profile No results for input(s): INR, PROTIME in the last 168 hours.  No results for input(s): DDIMER in the last 72 hours.  Cardiac Enzymes No results for input(s): CKMB, TROPONINI, MYOGLOBIN in the last 168 hours.  Invalid input(s): CK ------------------------------------------------------------------------------------------------------------------ No results found for: BNP  Inpatient Medications  Scheduled Meds: . enoxaparin (LOVENOX) injection  40 mg Subcutaneous Daily  . famotidine  20 mg Oral BID  . feeding supplement  1 Container Oral TID BM  . feeding supplement (PRO-STAT SUGAR FREE 64)  30 mL Oral TID WC  . ferrous sulfate  325 mg Oral BID WC  . folic acid  1 mg Oral Daily  . multivitamin with minerals  1 tablet Oral Daily  . nicotine  14 mg Transdermal Daily  . sodium chloride  2 g Oral TID WC  . thiamine  100 mg Oral Daily  . vitamin B-12  1,000 mcg Oral Daily   Continuous Infusions: . sodium chloride 125 mL/hr at 09/26/15 0833   PRN  Meds:.acetaminophen, alum & mag hydroxide-simeth, morphine injection, ondansetron (ZOFRAN) IV  Micro Results No results found for this or any previous visit (from the past 240 hour(s)).  Radiology Reports Ct Abdomen Pelvis W Contrast  09/20/2015  CLINICAL DATA:  Epigastric abdominal pain for 2 weeks. Vomiting. Lower extremity swelling. Inpatient. EXAM: CT ABDOMEN AND PELVIS WITH CONTRAST TECHNIQUE: Multidetector CT imaging of the abdomen and pelvis was performed using the standard protocol following bolus administration of intravenous contrast. CONTRAST:  80mL ISOVUE-300 IOPAMIDOL (ISOVUE-300) INJECTION 61% COMPARISON:  09/19/2015 abdominal sonogram. FINDINGS: Lower chest: No significant pulmonary nodules or acute consolidative airspace disease. Trace layering bilateral pleural effusions and minimal dependent atelectasis in both lower lobes. Hepatobiliary: Normal size liver. Diffuse hepatic steatosis. No liver mass. No definite liver surface irregularity. Nondistended gallbladder contains a 9 mm calcified gallstone. No gallbladder wall thickening or pericholecystic fat stranding. No biliary ductal dilatation. Common bile duct diameter 5 mm. Pancreas: Coarse calcifications in the pancreatic head. Thickening and parenchymal heterogeneity throughout the pancreatic body and tail with associated peripancreatic fluid and fat stranding. These findings are consistent with acute on chronic pancreatitis. There are a few low-attenuation pancreatic body  lesions, largest measuring 1.1 x 0.8 cm and 1.3 x 1.0 cm (series 2/ image 21). No areas of pancreatic parenchymal nonenhancement or gas. Spleen: Normal size. No mass. Adrenals/Urinary Tract: Normal adrenals. No hydronephrosis. No renal masses. Normal bladder. Stomach/Bowel: Status post distal gastrectomy with gastrojejunostomy. No definite wall thickening in the collapsed remnant proximal stomach. Normal caliber small bowel with no small bowel wall thickening. Normal  appendix. Normal large bowel with no diverticulosis, large bowel wall thickening or pericolonic fat stranding. Vascular/Lymphatic: Atherosclerotic nonaneurysmal abdominal aorta. Patent portal, splenic, hepatic and renal veins. Enlarged 1.7 cm gastrohepatic ligament node (series 2/ image 15). Porta hepatis lymphadenopathy up to 1.5 cm (series 2/image 16). No additional pathologically enlarged abdominopelvic nodes. Reproductive: Normal size prostate. Other: No pneumoperitoneum, ascites or focal intraperitoneal fluid collection. Musculoskeletal: No aggressive appearing focal osseous lesions. Moderate degenerative changes in the visualized thoracolumbar spine. IMPRESSION: 1. Acute on chronic pancreatitis. No evidence of necrotizing pancreatitis. 2. Small low-attenuation lesions in the pancreatic body, favor chronic and/or developing pseudocysts. Recommend attention on a follow-up CT abdomen with IV contrast or MRI abdomen without and with IV contrast in 3 months. 3. Trace layering bilateral pleural effusions. 4. Nonspecific mild upper retroperitoneal lymphadenopathy, which can also be reassessed on follow-up CT or MRI abdomen in 3 months. 5. Cholelithiasis. No evidence of acute cholecystitis. No biliary ductal dilatation. 6. Diffuse hepatic steatosis. Electronically Signed   By: Delbert PhenixJason A Poff M.D.   On: 09/20/2015 18:47   Dg Chest Port 1 View  09/20/2015  CLINICAL DATA:  Hyponatremia.  Alcohol use. EXAM: PORTABLE CHEST 1 VIEW COMPARISON:  None. FINDINGS: Slight interstitial pattern to the lung bases may indicate fibrosis or linear atelectasis. No focal consolidation. No blunting of costophrenic angles. No pneumothorax. Normal heart size and pulmonary vascularity. Mediastinal contours appear intact. IMPRESSION: Slight fibrosis or atelectasis in the lung bases. No evidence of active pulmonary disease. Electronically Signed   By: Burman NievesWilliam  Stevens M.D.   On: 09/20/2015 05:53   [place] Abdomen Limited Ruq  09/19/2015   CLINICAL DATA:  Epigastric pain for 2 weeks, vomiting for 3 days. History of ETOH abuse. EXAM: US ABDOMEN LIMITED - RIGHT UPPER QUADRANT COMPARISON:  None. FINDINGS: Gallbladder: Sludge and stones within the mildly distended gallbladder, largest stone measuring 9 mm greatest dimension. No gallbladder wall thickening, pericholecystic fluid or other secondary signs of acute cholecystitis. Common bile duct: Diameter: Normal at 5 mm. Liver: Liver is diffusely echogenic indicating fatty infiltration. No focal mass or lesion identified within the liver, although the fatty infiltration limits characterization of parenchymal detail. IMPRESSION: 1. Sludge and stones within the gallbladder, largest stone measuring 9 mm. No evidence of acute cholecystitis. 2. No bile duct dilatation. 3. Fatty infiltration of the liver. Electronically Signed   By: Bary RichardStan  Maynard M.D.   On: 09/19/2015 20:11    Time Spent in minutes 25   Amy Yu PA-S on 09/26/2015 at 10:55 AM  Between 7am to 7pm - Pager - [phone]  After 7pm go to [email or web address] - password [email or web address]  [facility] -  Office  [phone]

## 2015-09-26 NOTE — Progress Notes (Signed)
Assessment:  1 Metabolic derangement: hyponatremia, hypomagnesemia, hypokalemia I suspect nutritional(Beer Drinkers Potomania) with severe body deficits, perhaps exacerbated by prior partial gastrectomy and gastrojejunostomy (received 200mq KCL yesterday) 2 Chronic alcoholism 3 Abdominal pain, ? Multiple causes 4 Incisional hernia, reducible  5 Hx of distal gastrectomy and gastrojejunostomy 6 Pancreatitis by CT scan  Plan: 1 Continue aggressive solute electrolyte repletion, mag, K, Na prn plus food 2 Reduce/stop alcohol consumption 3 Ask surgery to eval incisional hernia    Subjective: Interval History: Abd pain persists Objective: Vital signs in last 24 hours: Temp:  [98.2 F (36.8 C)-100.1 F (37.8 C)] 99.2 F (37.3 C) (06/20 0457) Pulse Rate:  [70-79] 72 (06/20 0457) Resp:  [16-18] 18 (06/20 0457) BP: (96-111)/(48-61) 102/48 mmHg (06/20 0457) SpO2:  [99 %-100 %] 99 % (06/20 0457) Weight:  [51.71 kg (114 lb)] 51.71 kg (114 lb) (06/20 0457) Weight change: 0.499 kg (1 lb 1.6 oz)  Intake/Output from previous day: 06/19 0701 - 06/20 0700 In: 2973 [P.O.:1348; I.V.:1625] Out: 2050 [Urine:2050] Intake/Output this shift: Total I/O In: -  Out: 225 [Urine:225]  General appearance: alert and cooperative Resp: clear to auscultation bilaterally Chest wall: no tenderness Cardio: regular rate and rhythm, S1, S2 normal, no murmur, click, rub or gallop GI: tender left of midline incision Extremities: extremities normal, atraumatic, no cyanosis or edema  Lab Results: No results for input(s): WBC, HGB, HCT, PLT in the last 72 hours. BMET:  Recent Labs  09/25/15 0328 09/26/15 0233  NA 125* 127*  K 2.8* 5.1  CL 89* 97*  CO2 27 25  GLUCOSE 105* 101*  BUN 7 7  CREATININE 0.50* 0.51*  CALCIUM 7.8* 8.2*   No results for input(s): PTH in the last 72 hours. Iron Studies: No results for input(s): IRON, TIBC, TRANSFERRIN, FERRITIN in the last 72 hours. Studies/Results: No  results found.  Scheduled: . enoxaparin (LOVENOX) injection  40 mg Subcutaneous Daily  . famotidine  20 mg Oral BID  . feeding supplement  1 Container Oral TID BM  . feeding supplement (PRO-STAT SUGAR FREE 64)  30 mL Oral TID WC  . ferrous sulfate  325 mg Oral BID WC  . folic acid  1 mg Oral Daily  . magnesium oxide  800 mg Oral BID  . multivitamin with minerals  1 tablet Oral Daily  . nicotine  14 mg Transdermal Daily  . sodium chloride  2 g Oral TID WC  . thiamine  100 mg Oral Daily  . vitamin B-12  1,000 mcg Oral Daily    LOS: 6 days   Corrie Reder C 09/26/2015,8:40 AM

## 2015-09-26 NOTE — Progress Notes (Signed)
Nutrition Follow-up  DOCUMENTATION CODES:   Severe malnutrition in context of acute illness/injury, Underweight  INTERVENTION:  Provide Ensure Enlive po BID, each supplement provides 350 kcal and 20 grams of protein Provide Boost Breeze once daily, provides 250 kcal and 9 grams of protein Provide 30 ml of Pro-stat TID, each dose provides 100 kcal and 15 grams of protein Daily Multivitamin   NUTRITION DIAGNOSIS:   Malnutrition related to altered GI function as evidenced by percent weight loss, severe depletion of body fat, severe depletion of muscle mass.  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   Pt was advanced from a full liquid to a Regular diet on 6/16. Per nursing notes, pt is eating 0-100% of meals; 45-85% of some, 100% of some, 0% of a few. Pt's weight is up 1 lb from 4 days ago. Per chart, pt is declining Boost Breeze about 50% of the time with complaint that it is too sweet. He has been receiving Pro-stat TID. Per MD note, pt has off and on epigastric pain.  Labs: low sodium, low calcium, low chloride, low magnesium  Diet Order:  Diet regular Room service appropriate?: Yes; Fluid consistency:: Thin  Skin:  Reviewed, no issues  Last BM:  6/19  Height:   Ht Readings from Last 1 Encounters:  09/19/15 5\' 6"  (1.676 m)    Weight:   Wt Readings from Last 1 Encounters:  09/26/15 114 lb (51.71 kg)    Ideal Body  Weight:  64.5 kg  BMI:  Body mass index is 18.41 kg/(m^2).  Estimated Nutritional Needs:   Kcal:  1800-2000  Protein:  75-85 grams  Fluid:  1.6 L/day  EDUCATION NEEDS:   No education needs identified at this time  Dorothea Ogleeanne Kenny Rea RD, LDN Inpatient Clinical Dietitian Pager: [phone] After Hours Pager: [phone]

## 2015-09-27 ENCOUNTER — Inpatient Hospital Stay (HOSPITAL_COMMUNITY): Payer: Medicare Other

## 2015-09-27 DIAGNOSIS — G43A1 Cyclical vomiting, intractable: Secondary | ICD-10-CM

## 2015-09-27 DIAGNOSIS — F1010 Alcohol abuse, uncomplicated: Secondary | ICD-10-CM

## 2015-09-27 DIAGNOSIS — Z789 Other specified health status: Secondary | ICD-10-CM

## 2015-09-27 DIAGNOSIS — R1013 Epigastric pain: Secondary | ICD-10-CM

## 2015-09-27 DIAGNOSIS — R000 Tachycardia, unspecified: Secondary | ICD-10-CM

## 2015-09-27 LAB — ECHOCARDIOGRAM COMPLETE
CHL CUP MV DEC (S): 229
E/e' ratio: 5.56
EWDT: 229 ms
FS: 33 % (ref 28–44)
HEIGHTINCHES: 66 in
IV/PV OW: 0.84
LA ID, A-P, ES: 29 mm
LADIAMINDEX: 1.88 cm/m2
LEFT ATRIUM END SYS DIAM: 29 mm
LV E/e'average: 5.56
LV PW d: 10.9 mm — AB (ref 0.6–1.1)
LV TDI E'LATERAL: 10.9
LV e' LATERAL: 10.9 cm/s
LVEEMED: 5.56
LVOT area: 4.15 cm2
LVOT diameter: 23 mm
MV pk A vel: 61.1 m/s
MV pk E vel: 60.6 m/s
TDI e' medial: 7.29
WEIGHTICAEL: 1825.6 [oz_av]

## 2015-09-27 LAB — MAGNESIUM: MAGNESIUM: 1.7 mg/dL (ref 1.7–2.4)

## 2015-09-27 LAB — BASIC METABOLIC PANEL
Anion gap: 5 (ref 5–15)
BUN: 6 mg/dL (ref 6–20)
CALCIUM: 7.6 mg/dL — AB (ref 8.9–10.3)
CO2: 24 mmol/L (ref 22–32)
CREATININE: 0.45 mg/dL — AB (ref 0.61–1.24)
Chloride: 98 mmol/L — ABNORMAL LOW (ref 101–111)
Glucose, Bld: 90 mg/dL (ref 65–99)
Potassium: 3.7 mmol/L (ref 3.5–5.1)
SODIUM: 127 mmol/L — AB (ref 135–145)

## 2015-09-27 MED ORDER — PERFLUTREN LIPID MICROSPHERE
1.0000 mL | INTRAVENOUS | Status: AC | PRN
  Administered 2015-09-27: 4 mL via INTRAVENOUS
  Filled 2015-09-27 (×2): qty 10

## 2015-09-27 NOTE — Progress Notes (Signed)
PROGRESS NOTE                                                                                                                                                                                                             Patient Demographics:    Shannon MuirServando Lester, is a 65 y.o. male, DOB - 02/16/1951, ZOX:[id number]  Admit date - 09/19/2015   Admitting Physician Lorretta HarpXilin Niu, MD  LOS - 7  Brief Narrative   65 year old Hispanic male with history of alcohol abuse, laparotomy done 3 years back in [place] (as per patient had part of his stomach removed) presented at [facility] with 2 weeks of gradual dull aching epigastric abdominal pain constant with poor appetite (reports symptoms getting worse with eating) and some nausea. Denies any vomiting, fevers, chills, hematemesis or melena. Denies any diarrhea or constipation. Reports he may have lost some weight. Denies any sick contact or recent travel. Does not take any medicines or see his doctor.  Patient is a poor historian despite having interpretor present. Patient in [place] visiting friends, lives in [place], [place]. Epigastric pain started 1 month ago, that is constant. Pain worsens with food and therefore has not been eating much.  He has tried Tylenol and aspirin with no relief. He denies ever experiencing an episode like this before. His BM is 3-4x/day loose and appears poorly digested if he eats that day. Denies steatorrhea. He started drinking when he was 65 years old and for the past 30+ years, he has been drinking 3 bottles of beer/day.  In the ED he was found to have severe hypokalemia, hypomagnesemia and hyponatremia and patient transferred to [facility].   Subjective:   No concern this AM, no chest pain or shortness of breath.    Assessment  & Plan :   Principal Problem: Acute on Chronic Pancreatitis  - Suspect alcohol induced. - Electrolytes improving, will repeat in AM and supplement if needed - CT  abdomen shows acute on chronic pancreatitis with no evidence of necrotizing pancreatitis. Also shows small low-attenuation lesions in the pancreatic body, favor chronic and/or developing pseudocysts. - Ultrasound abdomen shows sludge and gallbladder stone without Murphy sign or CBD dilatation. - Will need pancreatic enzyme supplement on discharge. - tolerating diet well   Active Problems: Severe Hypokalemia and hypomagnesemia - K and Mg WNL but on low end of normal  - Continue to monitor on tele.( has frequent PVCs and tachy to 120s on minimal exertion) - Echo pending   Hyponatremia - Suspect dehydration and ? Beer potomania. Appreciate  renal evaluation - continue to supplement electrolytes - BMP in AM  Hypotension - improving with SBP in 100's  Iron deficiency anemia - no signs of bleeding  - CBC In AM  Alcohol abuse - Counseled on cessation. No withdrawal symptoms. Continue thiamine, folate and multivitamin.  Tobacco use - Counseled on cessation. Nicotine patch.  Hyperglycemia - A1c is 5.6.  Severe protein calorie malnutrition. - supplementation provided   Incisional hernia - Reducible, nontender  Code Status: Full code  Family Communication: None at bedside  Disposition Plan: Home once electrolytes improved. Lives in [place]  Barriers For Discharge: abnormal electrolytes , hypotension  Consults:  None  Procedures:  Ultrasound abdomen CT abdomen and pelvis  DVT Prophylaxis:  Lovenox   Antibiotics:  None    Objective:   Filed Vitals:   09/26/15 2119 09/27/15 0641 09/27/15 0643 09/27/15 1200  BP: 98/56 89/50 87/56  103/56  Pulse: 68 62  62  Temp: 98 F (36.7 C) 98.2 F (36.8 C)  97.8 F (36.6 C)  TempSrc: Oral Oral  Oral  Resp: 17 18  18   Height:      Weight:   51.755 kg (114 lb 1.6 oz)   SpO2: 100% 100%  100%    Wt Readings from Last 3 Encounters:  09/27/15 51.755 kg (114 lb 1.6 oz)     Intake/Output Summary (Last 24 hours) at 09/27/15  1617 Last data filed at 09/27/15 1124  Gross per 24 hour  Intake 3356.67 ml  Output   1775 ml  Net 1581.67 ml   Physical Exam Gen:No distress HEENT: Moist mucosa, supple neck Chest: clear b/l, no added sounds.  CVS: S1 and S2 normal, no murmurs GI: soft, midline laparotomy scar, nondistended, bowel sounds present. Small 3x3cm ventral hernia adjacent , reducible. Mild epigastric tenderness. Musculoskeletal: warm, no edema   Data Review:   Chemistries   Recent Labs Lab 09/21/15 0859  09/23/15 0828 09/24/15 0312 09/25/15 0328 09/26/15 0233 09/27/15 0320  NA 125*  < > 122* 123* 125* 127* 127*  K 3.1*  < > 3.1* 2.4* 2.8* 5.1 3.7  CL 84*  < > 89* 85* 89* 97* 98*  CO2 31  < > 26 29 27 25 24   GLUCOSE 204*  < > 185* 99 105* 101* 90  BUN <5*  < > 6 8 7 7 6   CREATININE 0.56*  < > 0.53* 0.49* 0.50* 0.51* 0.45*  CALCIUM 7.6*  < > 7.5* 7.7* 7.8* 8.2* 7.6*  MG 0.9*  < > 1.1* 1.4* 1.4* 1.6* 1.7  AST 21  --   --   --   --   --   --   ALT 14*  --   --   --   --   --   --   ALKPHOS 144*  --   --   --   --   --   --   BILITOT 0.8  --   --   --   --   --   --   < > = values in this interval not displayed. ------------------------------------------------------------------------------------------------------------------   Lab Results  Component Value Date   HGBA1C 5.6 09/20/2015   Scheduled Meds: . enoxaparin (LOVENOX) injection  40 mg Subcutaneous Daily  . famotidine  20 mg Oral BID  . feeding supplement  1 Container Oral Q24H  . feeding supplement (ENSURE ENLIVE)  237 mL Oral BID  . feeding supplement (PRO-STAT SUGAR FREE 64)  30 mL Oral TID WC  .  ferrous sulfate  325 mg Oral BID WC  . folic acid  1 mg Oral Daily  . multivitamin with minerals  1 tablet Oral Daily  . nicotine  14 mg Transdermal Daily  . sodium chloride  2 g Oral TID WC  . thiamine  100 mg Oral Daily  . vitamin B-12  1,000 mcg Oral Daily   Continuous Infusions:   PRN Meds:.acetaminophen, alum & mag  hydroxide-simeth, morphine injection, ondansetron (ZOFRAN) IV  Micro Results No results found for this or any previous visit (from the past 240 hour(s)).  Radiology Reports Ct Abdomen Pelvis W Contrast  09/20/2015  CLINICAL DATA:  Epigastric abdominal pain for 2 weeks. Vomiting. Lower extremity swelling. Inpatient. EXAM: CT ABDOMEN AND PELVIS WITH CONTRAST TECHNIQUE: Multidetector CT imaging of the abdomen and pelvis was performed using the standard protocol following bolus administration of intravenous contrast. CONTRAST:  80mL ISOVUE-300 IOPAMIDOL (ISOVUE-300) INJECTION 61% COMPARISON:  09/19/2015 abdominal sonogram. FINDINGS: Lower chest: No significant pulmonary nodules or acute consolidative airspace disease. Trace layering bilateral pleural effusions and minimal dependent atelectasis in both lower lobes. Hepatobiliary: Normal size liver. Diffuse hepatic steatosis. No liver mass. No definite liver surface irregularity. Nondistended gallbladder contains a 9 mm calcified gallstone. No gallbladder wall thickening or pericholecystic fat stranding. No biliary ductal dilatation. Common bile duct diameter 5 mm. Pancreas: Coarse calcifications in the pancreatic head. Thickening and parenchymal heterogeneity throughout the pancreatic body and tail with associated peripancreatic fluid and fat stranding. These findings are consistent with acute on chronic pancreatitis. There are a few low-attenuation pancreatic body lesions, largest measuring 1.1 x 0.8 cm and 1.3 x 1.0 cm (series 2/ image 21). No areas of pancreatic parenchymal nonenhancement or gas. Spleen: Normal size. No mass. Adrenals/Urinary Tract: Normal adrenals. No hydronephrosis. No renal masses. Normal bladder. Stomach/Bowel: Status post distal gastrectomy with gastrojejunostomy. No definite wall thickening in the collapsed remnant proximal stomach. Normal caliber small bowel with no small bowel wall thickening. Normal appendix. Normal large bowel with no  diverticulosis, large bowel wall thickening or pericolonic fat stranding. Vascular/Lymphatic: Atherosclerotic nonaneurysmal abdominal aorta. Patent portal, splenic, hepatic and renal veins. Enlarged 1.7 cm gastrohepatic ligament node (series 2/ image 15). Porta hepatis lymphadenopathy up to 1.5 cm (series 2/image 16). No additional pathologically enlarged abdominopelvic nodes. Reproductive: Normal size prostate. Other: No pneumoperitoneum, ascites or focal intraperitoneal fluid collection. Musculoskeletal: No aggressive appearing focal osseous lesions. Moderate degenerative changes in the visualized thoracolumbar spine. IMPRESSION: 1. Acute on chronic pancreatitis. No evidence of necrotizing pancreatitis. 2. Small low-attenuation lesions in the pancreatic body, favor chronic and/or developing pseudocysts. Recommend attention on a follow-up CT abdomen with IV contrast or MRI abdomen without and with IV contrast in 3 months. 3. Trace layering bilateral pleural effusions. 4. Nonspecific mild upper retroperitoneal lymphadenopathy, which can also be reassessed on follow-up CT or MRI abdomen in 3 months. 5. Cholelithiasis. No evidence of acute cholecystitis. No biliary ductal dilatation. 6. Diffuse hepatic steatosis. Electronically Signed   By: Delbert Phenix M.D.   On: 09/20/2015 18:47   Dg Chest Port 1 View  09/20/2015  CLINICAL DATA:  Hyponatremia.  Alcohol use. EXAM: PORTABLE CHEST 1 VIEW COMPARISON:  None. FINDINGS: Slight interstitial pattern to the lung bases may indicate fibrosis or linear atelectasis. No focal consolidation. No blunting of costophrenic angles. No pneumothorax. Normal heart size and pulmonary vascularity. Mediastinal contours appear intact. IMPRESSION: Slight fibrosis or atelectasis in the lung bases. No evidence of active pulmonary disease. Electronically Signed   By: Chrissie Noa  Andria Meuse M.D.   On: 09/20/2015 05:53   US Abdomen Limited Ruq  09/19/2015  CLINICAL DATA:  Epigastric pain for 2  weeks, vomiting for 3 days. History of ETOH abuse. EXAM: US ABDOMEN LIMITED - RIGHT UPPER QUADRANT COMPARISON:  None. FINDINGS: Gallbladder: Sludge and stones within the mildly distended gallbladder, largest stone measuring 9 mm greatest dimension. No gallbladder wall thickening, pericholecystic fluid or other secondary signs of acute cholecystitis. Common bile duct: Diameter: Normal at 5 mm. Liver: Liver is diffusely echogenic indicating fatty infiltration. No focal mass or lesion identified within the liver, although the fatty infiltration limits characterization of parenchymal detail. IMPRESSION: 1. Sludge and stones within the gallbladder, largest stone measuring 9 mm. No evidence of acute cholecystitis. 2. No bile duct dilatation. 3. Fatty infiltration of the liver. Electronically Signed   By: Bary Richard M.D.   On: 09/19/2015 20:11    Time Spent in minutes 25  MAGICK-Tomika Eckles PA-S on 09/27/2015 at 4:17 PM  Between 7am to 7pm - Pager - [phone]  After 7pm go to [email or web address] - password [facility]  [facility] -  Office  [phone]

## 2015-09-27 NOTE — Progress Notes (Signed)
Assessment:  1 Metabolic derangement: hyponatremia, hypomagnesemia, hypokalemia I suspect nutritional(Beer Drinkers Potomania) with severe body deficits, perhaps exacerbated by prior partial gastrectomy and gastrojejunostomy (received 200mq KCL yesterday) 2 Chronic alcoholism 3 Abdominal pain, ? Multiple causes 4 Incisional hernia, reducible  5 Hx of distal gastrectomy and gastrojejunostomy 6 Pancreatitis by CT scan  Plan: 1 Continue aggressive solute electrolyte repletion, mag, K, Na prn plus food 2 Reduce/stop alcohol consumption 3 Ask surgery to eval incisional hernia  4 We will sign off.  Anticipate continued improvement with repletion of solute  Subjective: Interval History: Better abd pain  Objective: Vital signs in last 24 hours: Temp:  [98 F (36.7 C)-98.7 F (37.1 C)] 98.2 F (36.8 C) (06/21 0641) Pulse Rate:  [62-69] 62 (06/21 0641) Resp:  [16-18] 18 (06/21 0641) BP: (87-105)/(50-60) 87/56 mmHg (06/21 0643) SpO2:  [100 %] 100 % (06/21 0641) Weight:  [51.755 kg (114 lb 1.6 oz)] 51.755 kg (114 lb 1.6 oz) (06/21 0643) Weight change: 0.045 kg (1.6 oz)  Intake/Output from previous day: 06/20 0701 - 06/21 0700 In: 3376.7 [P.O.:610; I.V.:2766.7] Out: 2100 [Urine:2100] Intake/Output this shift: Total I/O In: 360 [P.O.:360] Out: 200 [Urine:200]  General appearance: alert and cooperative GI: minimal tender hernia Extremities: extremities normal, atraumatic, no cyanosis or edema  Lab Results: No results for input(s): WBC, HGB, HCT, PLT in the last 72 hours. BMET:  Recent Labs  09/26/15 0233 09/27/15 0320  NA 127* 127*  K 5.1 3.7  CL 97* 98*  CO2 25 24  GLUCOSE 101* 90  BUN 7 6  CREATININE 0.51* 0.45*  CALCIUM 8.2* 7.6*   No results for input(s): PTH in the last 72 hours. Iron Studies: No results for input(s): IRON, TIBC, TRANSFERRIN, FERRITIN in the last 72 hours. Studies/Results: No results found.  Scheduled: . enoxaparin (LOVENOX) injection  40 mg  Subcutaneous Daily  . famotidine  20 mg Oral BID  . feeding supplement  1 Container Oral Q24H  . feeding supplement (ENSURE ENLIVE)  237 mL Oral BID  . feeding supplement (PRO-STAT SUGAR FREE 64)  30 mL Oral TID WC  . ferrous sulfate  325 mg Oral BID WC  . folic acid  1 mg Oral Daily  . multivitamin with minerals  1 tablet Oral Daily  . nicotine  14 mg Transdermal Daily  . sodium chloride  2 g Oral TID WC  . thiamine  100 mg Oral Daily  . vitamin B-12  1,000 mcg Oral Daily    LOS: 7 days   Breyanna Valera C 09/27/2015,9:58 AM

## 2015-09-27 NOTE — Progress Notes (Addendum)
Echocardiogram 2D Echocardiogram with Definity has been performed.  [name], Shannon Lester, Shannon Lester

## 2015-09-28 DIAGNOSIS — G43A0 Cyclical vomiting, not intractable: Secondary | ICD-10-CM

## 2015-09-28 LAB — BASIC METABOLIC PANEL
ANION GAP: 7 (ref 5–15)
BUN: 7 mg/dL (ref 6–20)
CALCIUM: 8.5 mg/dL — AB (ref 8.9–10.3)
CO2: 26 mmol/L (ref 22–32)
Chloride: 96 mmol/L — ABNORMAL LOW (ref 101–111)
Creatinine, Ser: 0.47 mg/dL — ABNORMAL LOW (ref 0.61–1.24)
GLUCOSE: 78 mg/dL (ref 65–99)
Potassium: 3.6 mmol/L (ref 3.5–5.1)
Sodium: 129 mmol/L — ABNORMAL LOW (ref 135–145)

## 2015-09-28 LAB — CBC
HCT: 23.5 % — ABNORMAL LOW (ref 39.0–52.0)
Hemoglobin: 7.9 g/dL — ABNORMAL LOW (ref 13.0–17.0)
MCH: 31.1 pg (ref 26.0–34.0)
MCHC: 33.6 g/dL (ref 30.0–36.0)
MCV: 92.5 fL (ref 78.0–100.0)
PLATELETS: 586 10*3/uL — AB (ref 150–400)
RBC: 2.54 MIL/uL — ABNORMAL LOW (ref 4.22–5.81)
RDW: 12.4 % (ref 11.5–15.5)
WBC: 5.8 10*3/uL (ref 4.0–10.5)

## 2015-09-28 LAB — PHOSPHORUS: Phosphorus: 3.8 mg/dL (ref 2.5–4.6)

## 2015-09-28 LAB — MAGNESIUM: Magnesium: 1.3 mg/dL — ABNORMAL LOW (ref 1.7–2.4)

## 2015-09-28 MED ORDER — POTASSIUM CHLORIDE CRYS ER 20 MEQ PO TBCR
40.0000 meq | EXTENDED_RELEASE_TABLET | Freq: Once | ORAL | Status: AC
  Administered 2015-09-28: 40 meq via ORAL
  Filled 2015-09-28: qty 2

## 2015-09-28 MED ORDER — MAGNESIUM SULFATE 2 GM/50ML IV SOLN
2.0000 g | Freq: Once | INTRAVENOUS | Status: AC
  Administered 2015-09-28: 2 g via INTRAVENOUS
  Filled 2015-09-28: qty 50

## 2015-09-28 NOTE — Progress Notes (Signed)
PROGRESS NOTE                                                                                                                                                                                                             Patient Demographics:    Shannon Lester, is a 65 y.o. male, DOB - 31-Jul-1950, ZOX:[id number]  Admit date - 09/19/2015   Admitting Physician Lorretta HarpXilin Niu, MD  LOS - 8  Brief Narrative   65 year old Hispanic male with history of alcohol abuse, laparotomy done 3 years back in [place] (as per patient had part of his stomach removed) [facility] with 2 weeks of gradual dull aching epigastric abdominal pain, constant with poor appetite (reports symptoms getting worse with eating) and some nausea.  Patient is a poor historian despite having interpretor present. Patient in [place] visiting friends, lives in [place], [place].   In the ED he was found to have severe hypokalemia, hypomagnesemia and hyponatremia and patient transferred to [facility].   Subjective:   No concern this AM, no chest pain or shortness of breath.    Assessment  & Plan :   Principal Problem: Acute on Chronic Pancreatitis  - Suspect alcohol induced. - Electrolytes more stable but Mg still low, needs more supplementation this AM, will give 2 gm IV today  - CT abdomen shows acute on chronic pancreatitis with no evidence of necrotizing pancreatitis. Also shows small low-attenuation lesions in the pancreatic body, favor chronic and/or developing pseudocysts. - Ultrasound abdomen shows sludge and gallbladder stone without Murphy sign or CBD dilatation. - Will need pancreatic enzyme supplement on discharge. - tolerating diet well  - repeat electrolytes in AM  Active Problems: Severe Hypokalemia and hypomagnesemia - supplement Mg this AM as it is 1.3 - repeat Mg in AM - phosph is WNL  - K is WNL but on low end of normal, will give dose of K-dur  - Continue to  monitor on tele.( has frequent PVCs and tachy to 120s on minimal exertion) - Echo with normal systolic nad diastolic function, EF 55% - repeat electrolytes in AM  Hyponatremia - Suspect dehydration and ? Beer potomania. Appreciate renal evaluation - improving: 127 --> 129  Hypotension - improving, SBP remains in 90's  Iron deficiency anemia - no signs of bleeding  - Hg down compared to admission values  - CBC In AM  Alcohol abuse - Counseled on cessation. No withdrawal symptoms. Continue thiamine, folate and multivitamin.  Tobacco use - Counseled on cessation. Nicotine patch.  Hyperglycemia - A1c  is 5.6.  Severe protein calorie malnutrition. - supplementation provided   Incisional hernia - Reducible, nontender  Code Status: Full code  Family Communication: None at bedside  Disposition Plan: Home once electrolytes improved. Lives in [place], possibly home in AM.   Barriers For Discharge: abnormal electrolytes , hypotension  Consults:  None  Procedures:  Ultrasound abdomen CT abdomen and pelvis  DVT Prophylaxis:  Lovenox   Antibiotics:  None    Objective:   Filed Vitals:   09/27/15 0643 09/27/15 1200 09/27/15 2038 09/28/15 0521  BP: 87/56 103/56 109/75 92/52  Pulse:  62 78 66  Temp:  97.8 F (36.6 C) 98.2 F (36.8 C) 97.7 F (36.5 C)  TempSrc:  Oral Oral Oral  Resp:  18 18 16   Height:      Weight: 51.755 kg (114 lb 1.6 oz)   51.03 kg (112 lb 8 oz)  SpO2:  100% 99% 100%    Wt Readings from Last 3 Encounters:  09/28/15 51.03 kg (112 lb 8 oz)     Intake/Output Summary (Last 24 hours) at 09/28/15 1035 Last data filed at 09/28/15 0909  Gross per 24 hour  Intake    960 ml  Output   1525 ml  Net   -565 ml   Physical Exam Gen:No distress HEENT: Moist mucosa, supple neck Chest: clear b/l, no added sounds.  CVS: S1 and S2 normal, no murmurs GI: soft, midline laparotomy scar, nondistended, bowel sounds present. Small 3x3cm ventral hernia adjacent  , reducible. Musculoskeletal: warm, no edema   Data Review:   Chemistries   Recent Labs Lab 09/24/15 0312 09/25/15 0328 09/26/15 0233 09/27/15 0320 09/28/15 0314  NA 123* 125* 127* 127* 129*  K 2.4* 2.8* 5.1 3.7 3.6  CL 85* 89* 97* 98* 96*  CO2 29 27 25 24 26   GLUCOSE 99 105* 101* 90 78  BUN 8 7 7 6 7   CREATININE 0.49* 0.50* 0.51* 0.45* 0.47*  CALCIUM 7.7* 7.8* 8.2* 7.6* 8.5*  MG 1.4* 1.4* 1.6* 1.7 1.3*   ------------------------------------------------------------------------------------------------------------------   Lab Results  Component Value Date   HGBA1C 5.6 09/20/2015   Scheduled Meds: . enoxaparin (LOVENOX) injection  40 mg Subcutaneous Daily  . famotidine  20 mg Oral BID  . feeding supplement  1 Container Oral Q24H  . feeding supplement (ENSURE ENLIVE)  237 mL Oral BID  . feeding supplement (PRO-STAT SUGAR FREE 64)  30 mL Oral TID WC  . ferrous sulfate  325 mg Oral BID WC  . folic acid  1 mg Oral Daily  . magnesium sulfate 1 - 4 g bolus IVPB  2 g Intravenous Once  . multivitamin with minerals  1 tablet Oral Daily  . nicotine  14 mg Transdermal Daily  . sodium chloride  2 g Oral TID WC  . thiamine  100 mg Oral Daily  . vitamin B-12  1,000 mcg Oral Daily   Continuous Infusions:   PRN Meds:.acetaminophen, alum & mag hydroxide-simeth, morphine injection, ondansetron (ZOFRAN) IV  Radiology Reports Ct Abdomen Pelvis W Contrast  09/20/2015  CLINICAL DATA:  Epigastric abdominal pain for 2 weeks. Vomiting. Lower extremity swelling. Inpatient. EXAM: CT ABDOMEN AND PELVIS WITH CONTRAST TECHNIQUE: Multidetector CT imaging of the abdomen and pelvis was performed using the standard protocol following bolus administration of intravenous contrast. CONTRAST:  80mL ISOVUE-300 IOPAMIDOL (ISOVUE-300) INJECTION 61% COMPARISON:  09/19/2015 abdominal sonogram. FINDINGS: Lower chest: No significant pulmonary nodules or acute consolidative airspace disease. Trace layering  bilateral pleural effusions  and minimal dependent atelectasis in both lower lobes. Hepatobiliary: Normal size liver. Diffuse hepatic steatosis. No liver mass. No definite liver surface irregularity. Nondistended gallbladder contains a 9 mm calcified gallstone. No gallbladder wall thickening or pericholecystic fat stranding. No biliary ductal dilatation. Common bile duct diameter 5 mm. Pancreas: Coarse calcifications in the pancreatic head. Thickening and parenchymal heterogeneity throughout the pancreatic body and tail with associated peripancreatic fluid and fat stranding. These findings are consistent with acute on chronic pancreatitis. There are a few low-attenuation pancreatic body lesions, largest measuring 1.1 x 0.8 cm and 1.3 x 1.0 cm (series 2/ image 21). No areas of pancreatic parenchymal nonenhancement or gas. Spleen: Normal size. No mass. Adrenals/Urinary Tract: Normal adrenals. No hydronephrosis. No renal masses. Normal bladder. Stomach/Bowel: Status post distal gastrectomy with gastrojejunostomy. No definite wall thickening in the collapsed remnant proximal stomach. Normal caliber small bowel with no small bowel wall thickening. Normal appendix. Normal large bowel with no diverticulosis, large bowel wall thickening or pericolonic fat stranding. Vascular/Lymphatic: Atherosclerotic nonaneurysmal abdominal aorta. Patent portal, splenic, hepatic and renal veins. Enlarged 1.7 cm gastrohepatic ligament node (series 2/ image 15). Porta hepatis lymphadenopathy up to 1.5 cm (series 2/image 16). No additional pathologically enlarged abdominopelvic nodes. Reproductive: Normal size prostate. Other: No pneumoperitoneum, ascites or focal intraperitoneal fluid collection. Musculoskeletal: No aggressive appearing focal osseous lesions. Moderate degenerative changes in the visualized thoracolumbar spine. IMPRESSION: 1. Acute on chronic pancreatitis. No evidence of necrotizing pancreatitis. 2. Small low-attenuation  lesions in the pancreatic body, favor chronic and/or developing pseudocysts. Recommend attention on a follow-up CT abdomen with IV contrast or MRI abdomen without and with IV contrast in 3 months. 3. Trace layering bilateral pleural effusions. 4. Nonspecific mild upper retroperitoneal lymphadenopathy, which can also be reassessed on follow-up CT or MRI abdomen in 3 months. 5. Cholelithiasis. No evidence of acute cholecystitis. No biliary ductal dilatation. 6. Diffuse hepatic steatosis. Electronically Signed   By: Delbert Phenix M.D.   On: 09/20/2015 18:47   Dg Chest Port 1 View  09/20/2015  CLINICAL DATA:  Hyponatremia.  Alcohol use. EXAM: PORTABLE CHEST 1 VIEW COMPARISON:  None. FINDINGS: Slight interstitial pattern to the lung bases may indicate fibrosis or linear atelectasis. No focal consolidation. No blunting of costophrenic angles. No pneumothorax. Normal heart size and pulmonary vascularity. Mediastinal contours appear intact. IMPRESSION: Slight fibrosis or atelectasis in the lung bases. No evidence of active pulmonary disease. Electronically Signed   By: Burman Nieves M.D.   On: 09/20/2015 05:53   US Abdomen Limited Ruq  09/19/2015  CLINICAL DATA:  Epigastric pain for 2 weeks, vomiting for 3 days. History of ETOH abuse. EXAM: US ABDOMEN LIMITED - RIGHT UPPER QUADRANT COMPARISON:  None. FINDINGS: Gallbladder: Sludge and stones within the mildly distended gallbladder, largest stone measuring 9 mm greatest dimension. No gallbladder wall thickening, pericholecystic fluid or other secondary signs of acute cholecystitis. Common bile duct: Diameter: Normal at 5 mm. Liver: Liver is diffusely echogenic indicating fatty infiltration. No focal mass or lesion identified within the liver, although the fatty infiltration limits characterization of parenchymal detail. IMPRESSION: 1. Sludge and stones within the gallbladder, largest stone measuring 9 mm. No evidence of acute cholecystitis. 2. No bile duct dilatation.  3. Fatty infiltration of the liver. Electronically Signed   By: Bary Richard M.D.   On: 09/19/2015 20:11    Time Spent in minutes 25  Debbora Presto, MD 09/28/2015 at 10:35 AM  Between 7am to 7pm - Pager - [phone]  After 7pm go  to [email or web address] - password [facility]  [facility] -  Office  [phone]

## 2015-09-28 NOTE — Progress Notes (Signed)
Notified Dr.K.Schorr via text page of the following lab results: Magnesium level 1.3, Hgb 7.9, and Platelet count 586.

## 2015-09-29 DIAGNOSIS — R109 Unspecified abdominal pain: Secondary | ICD-10-CM | POA: Insufficient documentation

## 2015-09-29 LAB — BASIC METABOLIC PANEL
ANION GAP: 3 — AB (ref 5–15)
BUN: 7 mg/dL (ref 6–20)
CALCIUM: 8.6 mg/dL — AB (ref 8.9–10.3)
CO2: 25 mmol/L (ref 22–32)
Chloride: 105 mmol/L (ref 101–111)
Creatinine, Ser: 0.58 mg/dL — ABNORMAL LOW (ref 0.61–1.24)
Glucose, Bld: 76 mg/dL (ref 65–99)
Potassium: 4.5 mmol/L (ref 3.5–5.1)
SODIUM: 133 mmol/L — AB (ref 135–145)

## 2015-09-29 LAB — CBC
HCT: 23.9 % — ABNORMAL LOW (ref 39.0–52.0)
Hemoglobin: 7.9 g/dL — ABNORMAL LOW (ref 13.0–17.0)
MCH: 31 pg (ref 26.0–34.0)
MCHC: 33.1 g/dL (ref 30.0–36.0)
MCV: 93.7 fL (ref 78.0–100.0)
PLATELETS: 632 10*3/uL — AB (ref 150–400)
RBC: 2.55 MIL/uL — AB (ref 4.22–5.81)
RDW: 12.8 % (ref 11.5–15.5)
WBC: 5.7 10*3/uL (ref 4.0–10.5)

## 2015-09-29 LAB — MAGNESIUM: Magnesium: 1.5 mg/dL — ABNORMAL LOW (ref 1.7–2.4)

## 2015-09-29 LAB — OCCULT BLOOD X 1 CARD TO LAB, STOOL: FECAL OCCULT BLD: NEGATIVE

## 2015-09-29 MED ORDER — MAGNESIUM SULFATE 2 GM/50ML IV SOLN
2.0000 g | Freq: Once | INTRAVENOUS | Status: AC
  Administered 2015-09-29: 2 g via INTRAVENOUS
  Filled 2015-09-29: qty 50

## 2015-09-29 MED ORDER — FERROUS SULFATE 325 (65 FE) MG PO TABS: 325.0000 mg | ORAL_TABLET | Freq: Two times a day (BID) | ORAL | Status: AC

## 2015-09-29 MED ORDER — FOLIC ACID 1 MG PO TABS: 1.0000 mg | ORAL_TABLET | Freq: Every day | ORAL | Status: AC

## 2015-09-29 NOTE — Discharge Summary (Addendum)
Physician Discharge Summary  Shannon MuirServando Lester ZHY:[id number] DOB: [birth year] DOA: 09/19/2015  PCP: No primary care provider on file.  Admit date: 09/19/2015 Discharge date: 09/29/2015  Recommendations for Outpatient Follow-up:  1. Pt will need to follow up with PCP in 2-3 weeks post discharge 2. Please obtain BMP to evaluate electrolytes and kidney function, Mag and Phosph levels, Na level  3. 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  Patient is a poor historian despite having interpretor present. Patient in [place] visiting friends, lives in [place], [place].   In the ED he was found to have severe hypokalemia, hypomagnesemia and hyponatremia and patient transferred to [facility].  Subjective:   No concern this AM, no chest pain or shortness of breath.   Assessment & Plan :   Principal Problem: Acute on Chronic Pancreatitis  - Suspect alcohol induced. - Electrolytes more stable but Mg still low, needs more supplementation this AM, will give 2 gm IV today  - CT abdomen shows acute on chronic pancreatitis with no evidence of necrotizing pancreatitis. Also shows small low-attenuation lesions in the pancreatic body, favor chronic and/or developing pseudocysts. - Ultrasound abdomen shows sludge and gallbladder  stone without Murphy sign or CBD dilatation. - tolerating diet well  - repeat electrolytes in AM prior to discharge   Active Problems: Severe Hypokalemia and hypomagnesemia - continue to supplement electrolytes - repeat Mg in AM, repeat BMP in AM - Echo with normal systolic nad diastolic function, EF 55%  Hyponatremia - Suspect dehydration and ? Beer potomania. Appreciate renal evaluation - improving: 127 --> 129 --> 133 - BMP in AM piror to discharge   Hypotension - improving, SBP remains in 90's  Iron deficiency anemia - no signs of bleeding  - Hg down compared to admission values   Alcohol abuse - Counseled on cessation. No withdrawal symptoms. Continue thiamine, folate and multivitamin.  Tobacco use - Counseled on cessation. Nicotine patch.  Hyperglycemia - A1c is 5.6.  Severe protein calorie malnutrition. - supplementation provided   Incisional hernia - Reducible, nontender  Code Status: Full code  Family Communication: None at bedside  Disposition Plan: Home  Procedures:  Ultrasound abdomen CT abdomen and pelvis       Discharge Exam: Filed Vitals:   09/29/15 0600 09/29/15 1514  BP: 89/56 99/44  Pulse: 81 87  Temp: 98.6 F (37 C) 97.9 F (36.6 C)  Resp:  18     General: Pt is alert, follows commands appropriately, not in acute distress Cardiovascular: Regular rate and rhythm, S1/S2 +, no murmurs, no rubs, no gallops Respiratory: Clear to auscultation bilaterally, no wheezing, no crackles, no rhonchi Abdominal: Soft, non tender, non distended, bowel sounds +, no guarding Extremities: no edema, no cyanosis, pulses palpable bilaterally DP and PT Neuro: Grossly nonfocal  Discharge Instructions     Medication List  TAKE these medications        ferrous sulfate 325 (65 FE) MG tablet  Take 1 tablet (325 mg total) by mouth 2 (two) times daily with a meal.     folic acid 1 MG tablet  Commonly known as:  FOLVITE  Take 1 tablet (1 mg  total) by mouth daily.     lipase/protease/amylase 1610912000 units Cpep capsule  Commonly known as:  CREON  Take 1 capsule (12,000 Units total) by mouth 3 (three) times daily before meals.     magnesium oxide 400 MG tablet  Commonly known as:  MAG-OX  Take 1 tablet (400 mg total) by mouth daily.            The results of significant diagnostics from this hospitalization (including imaging, microbiology, ancillary and laboratory) are listed below for reference.     Microbiology: No results found for this or any previous visit (from the past 240 hour(s)).   Labs: Basic Metabolic Panel:  Recent Labs Lab 09/25/15 0328 09/26/15 0233 09/27/15 0320 09/28/15 0314 09/29/15 0151  NA 125* 127* 127* 129* 133*  K 2.8* 5.1 3.7 3.6 4.5  CL 89* 97* 98* 96* 105  CO2 27 25 24 26 25   GLUCOSE 105* 101* 90 78 76  BUN 7 7 6 7 7   CREATININE 0.50* 0.51* 0.45* 0.47* 0.58*  CALCIUM 7.8* 8.2* 7.6* 8.5* 8.6*  MG 1.4* 1.6* 1.7 1.3* 1.5*  PHOS  --  2.7  --  3.8  --    Liver Function Tests:  Recent Labs Lab 09/26/15 0233  ALBUMIN 1.5*   No results for input(s): LIPASE, AMYLASE in the last 168 hours. No results for input(s): AMMONIA in the last 168 hours. CBC:  Recent Labs Lab 09/28/15 0314 09/29/15 0151  WBC 5.8 5.7  HGB 7.9* 7.9*  HCT 23.5* 23.9*  MCV 92.5 93.7  PLT 586* 632*   SIGNED: Time coordinating discharge: 30 minutes  MAGICK-Nai Borromeo, MD  [facility] 09/29/2015, 3:59 PM Pager [phone]  If 7PM-7AM, please contact night-coverage [email or web address] Password [email or web address]

## 2015-09-29 NOTE — Evaluation (Signed)
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     Clinical Impression  Patient evaluated by Physical Therapy with no further acute PT needs identified. Patient walking >400 ft independently with excellent velocity. All education has been completed and the patient has no further questions. PT is signing off. Thank you for this referral.     Follow Up Recommendations No PT follow up    Equipment Recommendations  None recommended by PT    Recommendations for Other Services       Precautions / Restrictions Precautions Precautions: None      Mobility  Bed Mobility Overal bed mobility: Independent                Transfers Overall transfer level: Independent                  Ambulation/Gait Ambulation/Gait assistance: Independent Ambulation Distance (Feet): 400 Feet Assistive device: None Gait Pattern/deviations: WFL(Within Functional Limits)   Gait velocity interpretation: >2.62 ft/sec, indicative of independent community ambulator General Gait Details: walks very fast; no drift with head turns or with changes in velocity  Stairs            Wheelchair Mobility    Modified Rankin (Stroke Patients Only)       Balance Overall balance assessment: Independent                                           Pertinent Vitals/Pain Pain Assessment: 0-10 Pain Score: 3  Pain Location: abd Pain Descriptors / Indicators: Discomfort;Grimacing Pain Intervention(s): Limited activity within patient's tolerance    Home Living Family/patient expects to be discharged to:: Private residence Living Arrangements: Other relatives               Additional Comments: pt lives in [place] however plans to stay in [place] x 2 weeks after d/c    Prior Function Level of Independence: Independent               Hand Dominance        Extremity/Trunk Assessment   Upper Extremity Assessment: Overall WFL for tasks assessed           Lower Extremity Assessment: Overall WFL for tasks assessed      Cervical / Trunk Assessment: Normal  Communication   Communication: Interpreter utilized;Prefers language other than English  Cognition Arousal/Alertness: Awake/alert Behavior During Therapy: WFL for tasks assessed/performed Overall Cognitive Status: Within Functional Limits for tasks assessed                      General Comments General comments (skin integrity, edema, etc.): Denies any balance problems. States he has been walking in halls now that he is not attached to IV    Exercises        Assessment/Plan    PT Assessment Patent does not need any further PT services  PT Diagnosis     PT Problem List    PT Treatment Interventions     PT Goals (Current goals can be found in the Care Plan section) Acute Rehab PT Goals  PT Goal Formulation: All assessment and education complete, DC therapy    Frequency     Barriers to discharge        Co-evaluation               End of Session   Activity Tolerance: Patient tolerated treatment well Patient left: with call bell/phone within reach;Other (comment) (sitting EOB preparing to eat lunch)           Time: [id number] PT Time Calculation (min) (ACUTE ONLY): 13 min   Charges:   PT Evaluation $PT Eval Low Complexity: 1 Procedure     PT G Codes:        Haja Crego 09/29/2015, 12:37 PM Pager [phone]

## 2015-09-29 NOTE — Discharge Instructions (Signed)
Dolor abdominal en adultos °(Abdominal Pain, Adult) °El dolor puede tener muchas causas. Normalmente la causa del dolor abdominal no es una enfermedad y mejorará sin tratamiento. Frecuentemente puede controlarse y tratarse en casa. Su médico le realizará un examen físico y posiblemente solicite análisis de sangre y radiografías para ayudar a determinar la gravedad de su dolor. Sin embargo, en muchos casos, debe transcurrir más tiempo antes de que se pueda encontrar una causa evidente del dolor. Antes de llegar a ese punto, es posible que su médico no sepa si necesita más pruebas o un tratamiento más profundo. °INSTRUCCIONES PARA EL CUIDADO EN EL HOGAR  °Esté atento al dolor para ver si hay cambios. Las siguientes indicaciones ayudarán a aliviar cualquier molestia que pueda sentir: °· Tome solo medicamentos de venta libre o recetados, según las indicaciones del médico. °· No tome laxantes a menos que se lo haya indicado su médico. °· Pruebe con una dieta líquida absoluta (caldo, té o agua) según se lo indique su médico. Introduzca gradualmente una dieta normal, según su tolerancia. °SOLICITE ATENCIÓN MÉDICA SI: °· Tiene dolor abdominal sin explicación. °· Tiene dolor abdominal relacionado con náuseas o diarrea. °· Tiene dolor cuando orina o defeca. °· Experimenta dolor abdominal que lo despierta de noche. °· Tiene dolor abdominal que empeora o mejora cuando come alimentos. °· Tiene dolor abdominal que empeora cuando come alimentos grasosos. °· Tiene fiebre. °SOLICITE ATENCIÓN MÉDICA DE INMEDIATO SI:  °· El dolor no desaparece en un plazo máximo de 2 horas. °· No deja de (vomitar). °· El dolor se siente solo en partes del abdomen, como el lado derecho o la parte inferior izquierda del abdomen. °· Evacúa materia fecal sanguinolenta o negra, de aspecto alquitranado. °ASEGÚRESE DE QUE: °· Comprende estas instrucciones. °· Controlará su afección. °· Recibirá ayuda de inmediato si no mejora o si empeora. °  °Esta  información no tiene como fin reemplazar el consejo del médico. Asegúrese de hacerle al médico cualquier pregunta que tenga. °  °Document Released: 03/25/2005 Document Revised: 04/15/2014 °Elsevier Interactive Patient Education ©2016 Elsevier Inc. ° °

## 2015-09-29 NOTE — Care Management Important Message (Signed)
Important Message  Patient Details  Name: Rance MuirServando Kluender MRN: [id number] Date of Birth: [birth year]   Medicare Important Message Given:  Yes    Bernadette HoitShoffner, Dierks Wach Coleman 09/29/2015, 9:45 AM

## 2015-09-29 NOTE — Progress Notes (Signed)
Interpreter Shannon Lester for [facility]

## 2015-09-30 LAB — CBC
HEMATOCRIT: 23.8 % — AB (ref 39.0–52.0)
HEMOGLOBIN: 7.8 g/dL — AB (ref 13.0–17.0)
MCH: 31.1 pg (ref 26.0–34.0)
MCHC: 32.8 g/dL (ref 30.0–36.0)
MCV: 94.8 fL (ref 78.0–100.0)
Platelets: 616 10*3/uL — ABNORMAL HIGH (ref 150–400)
RBC: 2.51 MIL/uL — ABNORMAL LOW (ref 4.22–5.81)
RDW: 12.9 % (ref 11.5–15.5)
WBC: 7.9 10*3/uL (ref 4.0–10.5)

## 2015-09-30 LAB — BASIC METABOLIC PANEL
ANION GAP: 6 (ref 5–15)
BUN: 11 mg/dL (ref 6–20)
CALCIUM: 8.5 mg/dL — AB (ref 8.9–10.3)
CO2: 23 mmol/L (ref 22–32)
Chloride: 102 mmol/L (ref 101–111)
Creatinine, Ser: 0.6 mg/dL — ABNORMAL LOW (ref 0.61–1.24)
GFR calc Af Amer: >60 mL/min (ref >60)
GLUCOSE: 92 mg/dL (ref 65–99)
POTASSIUM: 4.1 mmol/L (ref 3.5–5.1)
Sodium: 131 mmol/L — ABNORMAL LOW (ref 135–145)

## 2015-09-30 LAB — MAGNESIUM: MAGNESIUM: 1.2 mg/dL — AB (ref 1.7–2.4)

## 2015-09-30 MED ORDER — MAGNESIUM SULFATE 4 GM/100ML IV SOLN
4.0000 g | Freq: Once | INTRAVENOUS | Status: AC
  Administered 2015-09-30: 4 g via INTRAVENOUS
  Filled 2015-09-30: qty 100

## 2015-09-30 MED ORDER — PANCRELIPASE (LIP-PROT-AMYL) 12000-38000 UNITS PO CPEP: 12000.0000 [IU] | ORAL_CAPSULE | Freq: Three times a day (TID) | ORAL | Status: AC

## 2015-09-30 MED ORDER — MAGNESIUM OXIDE 400 MG PO TABS: 400.0000 mg | ORAL_TABLET | Freq: Every day | ORAL | Status: AC

## 2015-09-30 MED ORDER — MAGNESIUM OXIDE 400 (241.3 MG) MG PO TABS
400.0000 mg | ORAL_TABLET | Freq: Two times a day (BID) | ORAL | Status: DC
  Administered 2015-09-30 – 2015-10-03 (×7): 400 mg via ORAL
  Filled 2015-09-30 (×7): qty 1

## 2015-09-30 NOTE — Progress Notes (Signed)
Pt seen and examined at bedside, no chest pain or dyspnea, no concerns. His Mg is still low, will continue to supplement. He say she still does not have transportation to go to [place]. He will need Mag Ok to continue taking upon discharge.   Debbora PrestoMAGICK-Mindee Robledo, MD  [facility] Pager [phone]  If 7PM-7AM, please contact night-coverage [email or web address] Password [email or web address]

## 2015-10-01 LAB — COMPREHENSIVE METABOLIC PANEL
ALT: 17 U/L (ref 17–63)
AST: 22 U/L (ref 15–41)
Albumin: 1.5 g/dL — ABNORMAL LOW (ref 3.5–5.0)
Alkaline Phosphatase: 117 U/L (ref 38–126)
Anion gap: 7 (ref 5–15)
BILIRUBIN TOTAL: 0.2 mg/dL — AB (ref 0.3–1.2)
BUN: 11 mg/dL (ref 6–20)
CALCIUM: 8.5 mg/dL — AB (ref 8.9–10.3)
CHLORIDE: 100 mmol/L — AB (ref 101–111)
CO2: 25 mmol/L (ref 22–32)
CREATININE: 0.46 mg/dL — AB (ref 0.61–1.24)
GLUCOSE: 93 mg/dL (ref 65–99)
Potassium: 3.8 mmol/L (ref 3.5–5.1)
Sodium: 132 mmol/L — ABNORMAL LOW (ref 135–145)
Total Protein: 4.6 g/dL — ABNORMAL LOW (ref 6.5–8.1)

## 2015-10-01 LAB — CBC
HEMATOCRIT: 24.4 % — AB (ref 39.0–52.0)
Hemoglobin: 8.1 g/dL — ABNORMAL LOW (ref 13.0–17.0)
MCH: 31.3 pg (ref 26.0–34.0)
MCHC: 33.2 g/dL (ref 30.0–36.0)
MCV: 94.2 fL (ref 78.0–100.0)
PLATELETS: 624 10*3/uL — AB (ref 150–400)
RBC: 2.59 MIL/uL — ABNORMAL LOW (ref 4.22–5.81)
RDW: 12.8 % (ref 11.5–15.5)
WBC: 8.9 10*3/uL (ref 4.0–10.5)

## 2015-10-01 LAB — MAGNESIUM: Magnesium: 1.4 mg/dL — ABNORMAL LOW (ref 1.7–2.4)

## 2015-10-01 LAB — PHOSPHORUS: Phosphorus: 3.3 mg/dL (ref 2.5–4.6)

## 2015-10-01 MED ORDER — OXYCODONE-ACETAMINOPHEN 5-325 MG PO TABS: 1.0000 | ORAL_TABLET | ORAL | Status: DC | PRN

## 2015-10-01 MED ORDER — MAGNESIUM SULFATE 4 GM/100ML IV SOLN
4.0000 g | Freq: Once | INTRAVENOUS | Status: AC
  Administered 2015-10-01: 4 g via INTRAVENOUS
  Filled 2015-10-01: qty 100

## 2015-10-01 NOTE — Progress Notes (Signed)
PROGRESS NOTE                                                                                                                                                                                                             Patient Demographics:    Shannon MuirServando Lester, is a 65 y.o. male, DOB - 1950-04-11, ZOX:[id number]  Admit date - 09/19/2015   Admitting Physician Lorretta HarpXilin Niu, MD  LOS - 11  Brief Narrative   65 year old Hispanic male with history of alcohol abuse, laparotomy done [place] years back [place] (as per patient had part of his stomach removed) [facility] with 2 weeks of gradual dull aching epigastric abdominal pain, constant with poor appetite (reports symptoms getting worse with eating) and some nausea.  Patient is a poor historian despite having interpretor present. Patient in [place] visiting friends, lives in [place], [place].   In the ED he was found to have severe hypokalemia, hypomagnesemia and hyponatremia and patient transferred to [facility].   Subjective:   No concern this AM, no chest pain or shortness of breath.    Assessment  & Plan :   Principal Problem: Acute on Chronic Pancreatitis  - Suspect alcohol induced. - Electrolytes more stable but Mg still low, needs more supplementation this AM, will give 2 gm IV today  - CT abdomen shows acute on chronic pancreatitis with no evidence of necrotizing pancreatitis. Also shows small low-attenuation lesions in the pancreatic body, favor chronic and/or developing pseudocysts. - Ultrasound abdomen shows sludge and gallbladder stone without Murphy sign or CBD dilatation. - Will need pancreatic enzyme supplement on discharge. - tolerating diet well  - repeat electrolytes in AM and continue to supplement as indicated   Active Problems: Severe Hypokalemia and hypomagnesemia - supplement Mg this AM as it is 1.4 - repeat Mg in AM - K is WNL  - d/c tele  - Echo with normal systolic  nad diastolic function, EF 55% - repeat electrolytes in AM  Hyponatremia - Suspect dehydration and ? Beer potomania. Appreciate renal evaluation - improving: 127 --> 129 --> 132 - BMP in AM  Hypotension - improving, SBP remains in 90's  Iron deficiency anemia - no signs of bleeding  - Hg down compared to admission values  - CBC In AM  Alcohol abuse - Counseled on cessation. No withdrawal symptoms.  - Continue thiamine, folate and multivitamin.  Tobacco use - Counseled on cessation. Nicotine patch.  Hyperglycemia - A1c is 5.6.  Severe protein calorie malnutrition. - supplementation provided   Incisional  hernia - Reducible, nontender  Code Status: Full code  Family Communication: None at bedside  Disposition Plan: Lives in [place]  Barriers For Discharge: Mg still very low and need continued supplementation, also pt has no transport [place]   Consults:  None  Procedures:  Ultrasound abdomen CT abdomen and pelvis  DVT Prophylaxis:  Lovenox   Antibiotics:  None    Objective:   Filed Vitals:   09/30/15 0720 09/30/15 1115 09/30/15 2155 10/01/15 0604  BP:  91/53 102/57 91/52  Pulse:  93 83 77  Temp:  98.7 F (37.1 C) 98.5 F (36.9 C) 98.6 F (37 C)  TempSrc:  Oral Oral Oral  Resp:  16 16 16   Height:      Weight: 50.304 kg (110 lb 14.4 oz)   51.6 kg (113 lb 12.1 oz)  SpO2:  99% 100% 99%    Wt Readings from Last 3 Encounters:  10/01/15 51.6 kg (113 lb 12.1 oz)     Intake/Output Summary (Last 24 hours) at 10/01/15 0850 Last data filed at 10/01/15 0239  Gross per 24 hour  Intake   1197 ml  Output   1225 ml  Net    -28 ml   Physical Exam Gen:No distress HEENT: Moist mucosa, supple neck Chest: clear b/l, no added sounds.  CVS: S1 and S2 normal, no murmurs GI: soft, midline laparotomy scar, nondistended, bowel sounds present. Small 3x3cm ventral hernia adjacent , reducible. Musculoskeletal: warm, no edema   Data Review:   Chemistries    Recent Labs Lab 09/27/15 0320 09/28/15 0314 09/29/15 0151 09/30/15 0450 10/01/15 0259  NA 127* 129* 133* 131* 132*  K 3.7 3.6 4.5 4.1 3.8  CL 98* 96* 105 102 100*  CO2 24 26 25 23 25   GLUCOSE 90 78 76 92 93  BUN 6 7 7 11 11   CREATININE 0.45* 0.47* 0.58* 0.60* 0.46*  CALCIUM 7.6* 8.5* 8.6* 8.5* 8.5*  MG 1.7 1.3* 1.5* 1.2* 1.4*  AST  --   --   --   --  22  ALT  --   --   --   --  17  ALKPHOS  --   --   --   --  117  BILITOT  --   --   --   --  0.2*   ------------------------------------------------------------------------------------------------------------------   Lab Results  Component Value Date   HGBA1C 5.6 09/20/2015   Scheduled Meds: . enoxaparin (LOVENOX) injection  40 mg Subcutaneous Daily  . famotidine  20 mg Oral BID  . feeding supplement  1 Container Oral Q24H  . feeding supplement (ENSURE ENLIVE)  237 mL Oral BID  . feeding supplement (PRO-STAT SUGAR FREE 64)  30 mL Oral TID WC  . ferrous sulfate  325 mg Oral BID WC  . folic acid  1 mg Oral Daily  . magnesium oxide  400 mg Oral BID  . magnesium sulfate 1 - 4 g bolus IVPB  4 g Intravenous Once  . multivitamin with minerals  1 tablet Oral Daily  . nicotine  14 mg Transdermal Daily  . sodium chloride  2 g Oral TID WC  . thiamine  100 mg Oral Daily  . vitamin B-12  1,000 mcg Oral Daily   Continuous Infusions:   PRN Meds:.acetaminophen, alum & mag hydroxide-simeth, morphine injection, ondansetron (ZOFRAN) IV  Radiology Reports Ct Abdomen Pelvis W Contrast  09/20/2015  CLINICAL DATA:  Epigastric abdominal pain for 2 weeks. Vomiting. Lower extremity swelling. Inpatient.  EXAM: CT ABDOMEN AND PELVIS WITH CONTRAST TECHNIQUE: Multidetector CT imaging of the abdomen and pelvis was performed using the standard protocol following bolus administration of intravenous contrast. CONTRAST:  80mL ISOVUE-300 IOPAMIDOL (ISOVUE-300) INJECTION 61% COMPARISON:  09/19/2015 abdominal sonogram. FINDINGS: Lower chest: No  significant pulmonary nodules or acute consolidative airspace disease. Trace layering bilateral pleural effusions and minimal dependent atelectasis in both lower lobes. Hepatobiliary: Normal size liver. Diffuse hepatic steatosis. No liver mass. No definite liver surface irregularity. Nondistended gallbladder contains a 9 mm calcified gallstone. No gallbladder wall thickening or pericholecystic fat stranding. No biliary ductal dilatation. Common bile duct diameter 5 mm. Pancreas: Coarse calcifications in the pancreatic head. Thickening and parenchymal heterogeneity throughout the pancreatic body and tail with associated peripancreatic fluid and fat stranding. These findings are consistent with acute on chronic pancreatitis. There are a few low-attenuation pancreatic body lesions, largest measuring 1.1 x 0.8 cm and 1.3 x 1.0 cm (series 2/ image 21). No areas of pancreatic parenchymal nonenhancement or gas. Spleen: Normal size. No mass. Adrenals/Urinary Tract: Normal adrenals. No hydronephrosis. No renal masses. Normal bladder. Stomach/Bowel: Status post distal gastrectomy with gastrojejunostomy. No definite wall thickening in the collapsed remnant proximal stomach. Normal caliber small bowel with no small bowel wall thickening. Normal appendix. Normal large bowel with no diverticulosis, large bowel wall thickening or pericolonic fat stranding. Vascular/Lymphatic: Atherosclerotic nonaneurysmal abdominal aorta. Patent portal, splenic, hepatic and renal veins. Enlarged 1.7 cm gastrohepatic ligament node (series 2/ image 15). Porta hepatis lymphadenopathy up to 1.5 cm (series 2/image 16). No additional pathologically enlarged abdominopelvic nodes. Reproductive: Normal size prostate. Other: No pneumoperitoneum, ascites or focal intraperitoneal fluid collection. Musculoskeletal: No aggressive appearing focal osseous lesions. Moderate degenerative changes in the visualized thoracolumbar spine. IMPRESSION: 1. Acute on chronic  pancreatitis. No evidence of necrotizing pancreatitis. 2. Small low-attenuation lesions in the pancreatic body, favor chronic and/or developing pseudocysts. Recommend attention on a follow-up CT abdomen with IV contrast or MRI abdomen without and with IV contrast in 3 months. 3. Trace layering bilateral pleural effusions. 4. Nonspecific mild upper retroperitoneal lymphadenopathy, which can also be reassessed on follow-up CT or MRI abdomen in 3 months. 5. Cholelithiasis. No evidence of acute cholecystitis. No biliary ductal dilatation. 6. Diffuse hepatic steatosis. Electronically Signed   By: Delbert Phenix M.D.   On: 09/20/2015 18:47   Dg Chest Port 1 View  09/20/2015  CLINICAL DATA:  Hyponatremia.  Alcohol use. EXAM: PORTABLE CHEST 1 VIEW COMPARISON:  None. FINDINGS: Slight interstitial pattern to the lung bases may indicate fibrosis or linear atelectasis. No focal consolidation. No blunting of costophrenic angles. No pneumothorax. Normal heart size and pulmonary vascularity. Mediastinal contours appear intact. IMPRESSION: Slight fibrosis or atelectasis in the lung bases. No evidence of active pulmonary disease. Electronically Signed   By: Burman Nieves M.D.   On: 09/20/2015 05:53   US Abdomen Limited Ruq  09/19/2015  CLINICAL DATA:  Epigastric pain for 2 weeks, vomiting for 3 days. History of ETOH abuse. EXAM: US ABDOMEN LIMITED - RIGHT UPPER QUADRANT COMPARISON:  None. FINDINGS: Gallbladder: Sludge and stones within the mildly distended gallbladder, largest stone measuring 9 mm greatest dimension. No gallbladder wall thickening, pericholecystic fluid or other secondary signs of acute cholecystitis. Common bile duct: Diameter: Normal at 5 mm. Liver: Liver is diffusely echogenic indicating fatty infiltration. No focal mass or lesion identified within the liver, although the fatty infiltration limits characterization of parenchymal detail. IMPRESSION: 1. Sludge and stones within the gallbladder, largest stone  measuring 9 mm.  No evidence of acute cholecystitis. 2. No bile duct dilatation. 3. Fatty infiltration of the liver. Electronically Signed   By: Bary Richard M.D.   On: 09/19/2015 20:11    Time Spent in minutes 25  MAGICK-Debbi Strandberg, MD 10/01/2015 at 8:50 AM  Between 7am to 7pm - Pager - [phone]  After 7pm go to [email or web address] - password [facility]  [facility] -  Office  [phone]

## 2015-10-01 NOTE — Progress Notes (Signed)
CM notes pt is from [place] and is staying with friends in [place]; consult for transportation needs appropriate for social worker; consult for CSW placed.  No other CM needs were communicated.

## 2015-10-02 LAB — BASIC METABOLIC PANEL
ANION GAP: 5 (ref 5–15)
BUN: 11 mg/dL (ref 6–20)
CHLORIDE: 98 mmol/L — AB (ref 101–111)
CO2: 29 mmol/L (ref 22–32)
Calcium: 8.4 mg/dL — ABNORMAL LOW (ref 8.9–10.3)
Creatinine, Ser: 0.52 mg/dL — ABNORMAL LOW (ref 0.61–1.24)
Glucose, Bld: 93 mg/dL (ref 65–99)
POTASSIUM: 3.7 mmol/L (ref 3.5–5.1)
SODIUM: 132 mmol/L — AB (ref 135–145)

## 2015-10-02 LAB — CBC
HCT: 25 % — ABNORMAL LOW (ref 39.0–52.0)
HEMOGLOBIN: 8.5 g/dL — AB (ref 13.0–17.0)
MCH: 32.4 pg (ref 26.0–34.0)
MCHC: 34 g/dL (ref 30.0–36.0)
MCV: 95.4 fL (ref 78.0–100.0)
Platelets: 637 10*3/uL — ABNORMAL HIGH (ref 150–400)
RBC: 2.62 MIL/uL — AB (ref 4.22–5.81)
RDW: 12.9 % (ref 11.5–15.5)
WBC: 8.7 10*3/uL (ref 4.0–10.5)

## 2015-10-02 LAB — PHOSPHORUS: PHOSPHORUS: 4.2 mg/dL (ref 2.5–4.6)

## 2015-10-02 LAB — MAGNESIUM: MAGNESIUM: 1.5 mg/dL — AB (ref 1.7–2.4)

## 2015-10-02 MED ORDER — OXYCODONE-ACETAMINOPHEN 5-325 MG PO TABS: 1.0000 | ORAL_TABLET | ORAL | Status: AC | PRN

## 2015-10-02 MED ORDER — MAGNESIUM SULFATE 4 GM/100ML IV SOLN
4.0000 g | Freq: Once | INTRAVENOUS | Status: AC
  Administered 2015-10-02: 4 g via INTRAVENOUS
  Filled 2015-10-02 (×2): qty 100

## 2015-10-02 NOTE — Discharge Summary (Signed)
Physician Discharge Summary  Shannon MuirServando Nicodemus ZOX:[id number] DOB: Jun 06, 1950 DOA: 09/19/2015  PCP: No primary care provider on file.  Admit date: 09/19/2015 Discharge date: 10/02/2015  Recommendations for Outpatient Follow-up:  1. Pt will need to follow up with PCP in 2-3 weeks post discharge 2. Please obtain BMP to evaluate electrolytes and kidney function, Mag and Phosph levels, Na level  3. 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  Patient is a poor historian despite having interpretor present. Patient in [place] visiting friends, lives in [place], [place].   In the ED he was found to have severe hypokalemia, hypomagnesemia and hyponatremia and patient [facility].  Subjective:   No concern this AM, no chest pain or shortness of breath.   Assessment & Plan :   Principal Problem: Acute on Chronic Pancreatitis  - Suspect alcohol induced. - Electrolytes more stable but Mg still low, needs more supplementation this AM, will give 2 gm IV today  - CT abdomen shows acute on chronic pancreatitis with no evidence of necrotizing pancreatitis. Also shows small low-attenuation lesions in the pancreatic body, favor chronic and/or developing pseudocysts. - Ultrasound abdomen shows sludge  and gallbladder stone without Murphy sign or CBD dilatation. - tolerating diet well  - repeat electrolytes in AM prior to discharge   Active Problems: Severe Hypokalemia and hypomagnesemia - continue to supplement electrolytes - Echo with normal systolic and diastolic function, EF 55%  Hyponatremia - Suspect dehydration and ? Beer potomania. Appreciate renal evaluation - improving: 127 --> 129 --> 132  Hypotension - improving, SBP remains in 90's   Iron deficiency anemia - no signs of bleeding  - Hg overall stable   Alcohol abuse - Counseled on cessation. No withdrawal symptoms. Continue thiamine, folate and multivitamin.  Tobacco use - Counseled on cessation. Nicotine patch.  Hyperglycemia - A1c is 5.6  Severe protein calorie malnutrition. - supplementation provided   Incisional hernia - Reducible, nontender  Code Status: Full code  Family Communication: None at bedside  Disposition Plan: Home  Procedures:  Ultrasound abdomen CT abdomen and pelvis       Discharge Exam: Filed Vitals:   10/01/15 2131 10/02/15 0714  BP: 100/54 89/55  Pulse: 91 77  Temp: 98 F (36.7 C) 98.2 F (36.8 C)  Resp: 18 17     General: Pt is alert, follows commands appropriately, not in acute distress Cardiovascular: Regular rate and rhythm, S1/S2 +, no murmurs, no rubs, no gallops Respiratory: Clear to auscultation bilaterally, no wheezing, no crackles, no rhonchi Abdominal: Soft, non tender, non distended, bowel sounds +, no guarding Extremities: no edema, no cyanosis, pulses palpable bilaterally DP and PT Neuro: Grossly nonfocal  Discharge Instructions      Discharge Instructions    Diet - low sodium heart healthy    Complete by:  As directed  Increase activity slowly    Complete by:  As directed             Medication List    TAKE these medications        ferrous sulfate 325 (65 FE) MG tablet  Take 1 tablet (325 mg total) by mouth 2 (two) times daily  with a meal.     folic acid 1 MG tablet  Commonly known as:  FOLVITE  Take 1 tablet (1 mg total) by mouth daily.     lipase/protease/amylase 1610912000 units Cpep capsule  Commonly known as:  CREON  Take 1 capsule (12,000 Units total) by mouth 3 (three) times daily before meals.     magnesium oxide 400 MG tablet  Commonly known as:  MAG-OX  Take 1 tablet (400 mg total) by mouth daily.     oxyCODONE-acetaminophen 5-325 MG tablet  Commonly known as:  PERCOCET/ROXICET  Take 1-2 tablets by mouth every 3 (three) hours as needed for moderate pain.         Follow-up Information    Call Debbora PrestoMAGICK-Frederich Montilla, MD.   Specialty:  Internal Medicine   Why:  As needed   Contact information:   [address] [place] [address] [phone]        The results of significant diagnostics from this hospitalization (including imaging, microbiology, ancillary and laboratory) are listed below for reference.     Microbiology: No results found for this or any previous visit (from the past 240 hour(s)).   Labs: Basic Metabolic Panel:  Recent Labs Lab 09/26/15 0233  09/28/15 0314 09/29/15 0151 09/30/15 0450 10/01/15 0259 10/02/15 0647  NA 127*  < > 129* 133* 131* 132* 132*  K 5.1  < > 3.6 4.5 4.1 3.8 3.7  CL 97*  < > 96* 105 102 100* 98*  CO2 25  < > 26 25 23 25 29   GLUCOSE 101*  < > 78 76 92 93 93  BUN 7  < > 7 7 11 11 11   CREATININE 0.51*  < > 0.47* 0.58* 0.60* 0.46* 0.52*  CALCIUM 8.2*  < > 8.5* 8.6* 8.5* 8.5* 8.4*  MG 1.6*  < > 1.3* 1.5* 1.2* 1.4* 1.5*  PHOS 2.7  --  3.8  --   --  3.3 4.2  < > = values in this interval not displayed. Liver Function Tests:  Recent Labs Lab 09/26/15 0233 10/01/15 0259  AST  --  22  ALT  --  17  ALKPHOS  --  117  BILITOT  --  0.2*  PROT  --  4.6*  ALBUMIN 1.5* 1.5*   No results for input(s): LIPASE, AMYLASE in the last 168 hours. No results for input(s): AMMONIA in the last 168 hours. CBC:  Recent Labs Lab  09/28/15 0314 09/29/15 0151 09/30/15 0450 10/01/15 0259 10/02/15 0647  WBC 5.8 5.7 7.9 8.9 8.7  HGB 7.9* 7.9* 7.8* 8.1* 8.5*  HCT 23.5* 23.9* 23.8* 24.4* 25.0*  MCV 92.5 93.7 94.8 94.2 95.4  PLT 586* 632* 616* 624* 637*   SIGNED: Time coordinating discharge: 30 minutes  MAGICK-Rosena Bartle, MD  [facility] 10/02/2015, 9:49 AM Pager [phone]  If 7PM-7AM, please contact night-coverage [email or web address] Password [email or web address]

## 2015-10-02 NOTE — Care Management Important Message (Signed)
Important Message  Patient Details  Name: Shannon MuirServando Lester MRN: [id number] Date of Birth: 12-29-1950   Medicare Important Message Given:  Yes    Bernadette HoitShoffner, Nicholas Trompeter Coleman 10/02/2015, 8:46 AM

## 2015-10-02 NOTE — Care Management Note (Signed)
Case Management Note  Patient Details  Name: Shannon MuirServando Lester MRN: [id number] Date of Birth: [birth year]  Subjective/Objective:    Admitted with Nausea and vomiting                Action/Plan: Patient works in [employer] traveling from state to state. Patient [place] to go to [place] job. At discharge he plans to stay in a hotel until he leaves. CM talked to patient via interpreter  Garcella. CM informed pt of the [facility] for ongoing medical care until he leaves for [place]. Also CM talked to the patient about [facility] or [facility] if he choose. He is independent of all of his ADL's. No DME is needed. He is currently calling his supervisor for transportation to the hotel.  Expected Discharge Date:   10/02/2015               Expected Discharge Plan:  Home/Self Care  In-House Referral:  Interpreting Services  Discharge planning Services  CM Consult Choice offered to:  Patient      Status of Service:  In process, will continue to follow  Reola MosherChandler, Laqueta Bonaventura L, RN,MHA,BSN [phone] 10/02/2015, 12:11 PM

## 2015-10-03 LAB — MAGNESIUM: MAGNESIUM: 1.5 mg/dL — AB (ref 1.7–2.4)

## 2015-10-03 MED ORDER — MAGNESIUM SULFATE 2 GM/50ML IV SOLN
2.0000 g | Freq: Once | INTRAVENOUS | Status: DC
  Filled 2015-10-03: qty 50

## 2015-10-03 NOTE — Progress Notes (Signed)
Patient not discharged yesterday as planned due to transportation issues.  CM talked to patient today with interpreter Garcella; His supervisor Migel [phone]( [phone]) was called for [place], he stated that he will pick him up for discharge at 12 noon outside at the entrance area of the hospital. Patient understands that he is discharged and and is aware of his ride coming at 12 noon. Abelino DerrickB Camia Dipinto Doctors HospitalRN,MHA,BSN [phone]

## 2015-10-03 NOTE — Progress Notes (Signed)
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

## 2015-10-03 NOTE — Progress Notes (Signed)
Stable for discharge, Mg level checked prior to discharge, will give one dose of Mg IV prior to discharge.   Debbora PrestoMAGICK-Kaetlyn Noa, MD  [facility] Pager [phone]  If 7PM-7AM, please contact night-coverage [email or web address] Password [email or web address]

## 2015-10-03 NOTE — Progress Notes (Signed)
Pt's ride is here, and volunteer services is being transferred to [facility] for pick up.

## 2015-11-06 ENCOUNTER — Ambulatory Visit: Payer: Medicare Other | Admitting: Family Medicine

## 2015-12-08 DEATH — deceased

## 2017-03-11 IMAGING — US US ABDOMEN LIMITED
1 series · 14 of 25 positions shown · non-contrast
Comparison: None.

CLINICAL DATA: Epigastric pain for 2 weeks, vomiting for 3 days.
History of ETOH abuse.

EXAM:
US ABDOMEN LIMITED - RIGHT UPPER QUADRANT

[Series 1: us abdomen limited · 0.13mm/px · 14 of 54 slices shown]
[im 1/54]
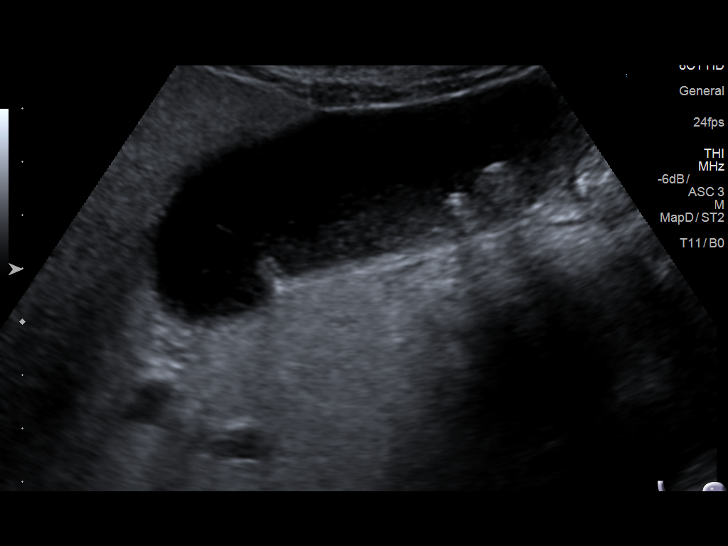
[im 5/54]
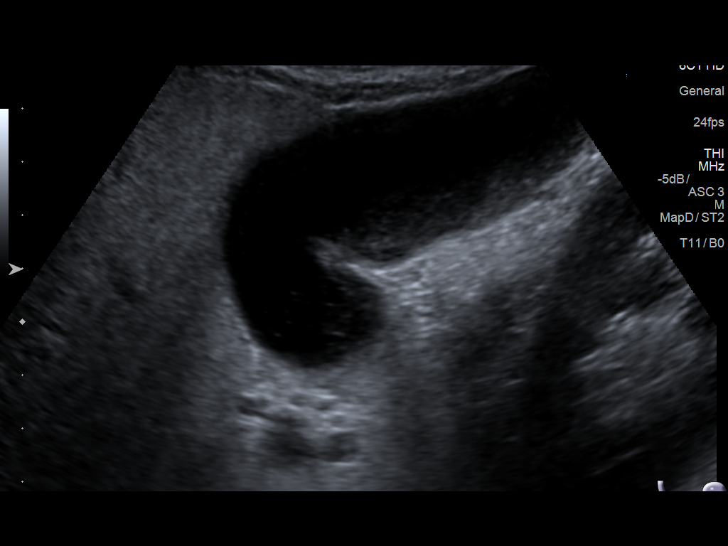
[im 9/54]
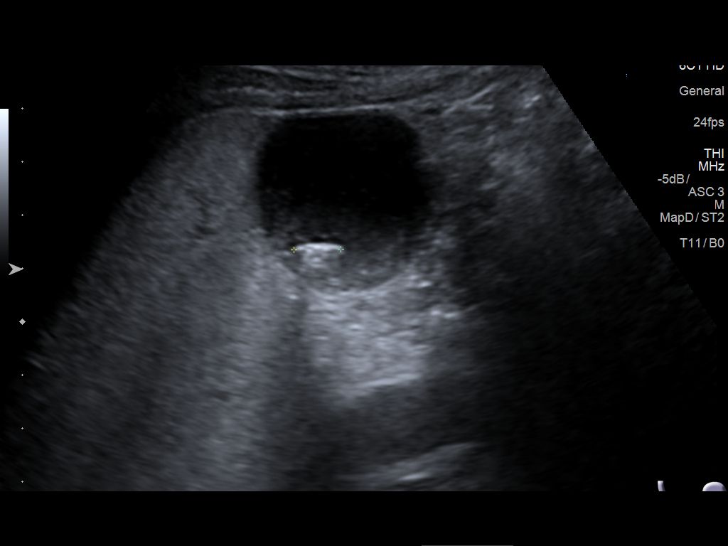
[im 14/54]
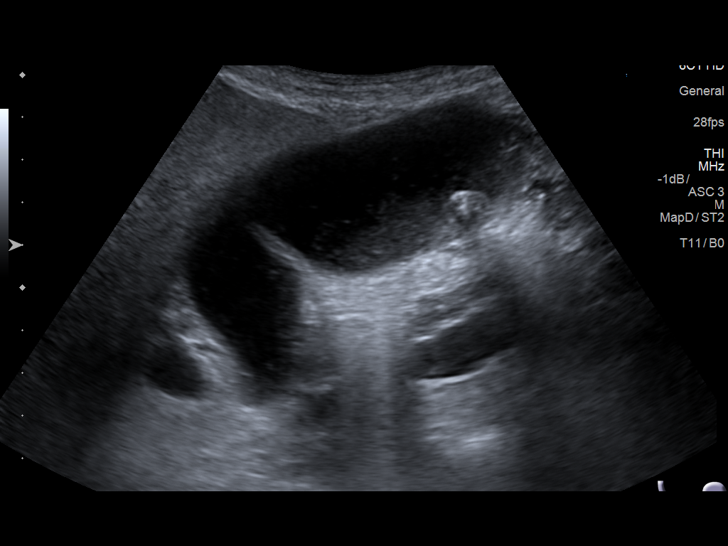
[im 18/54]
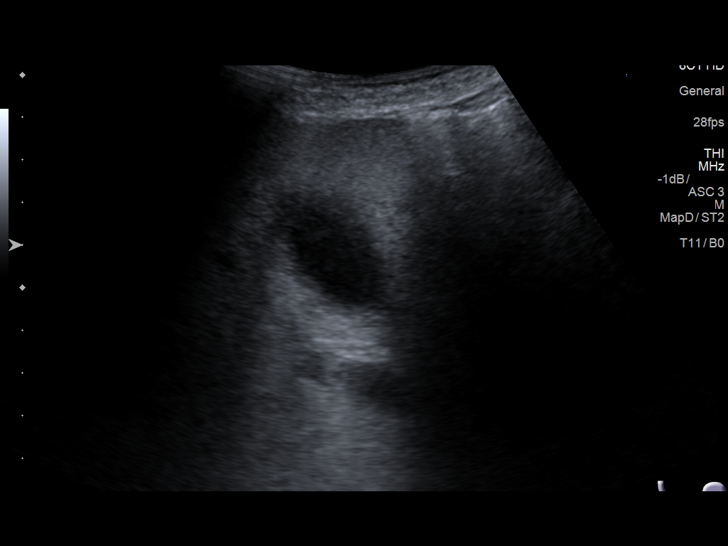
[im 20/54]
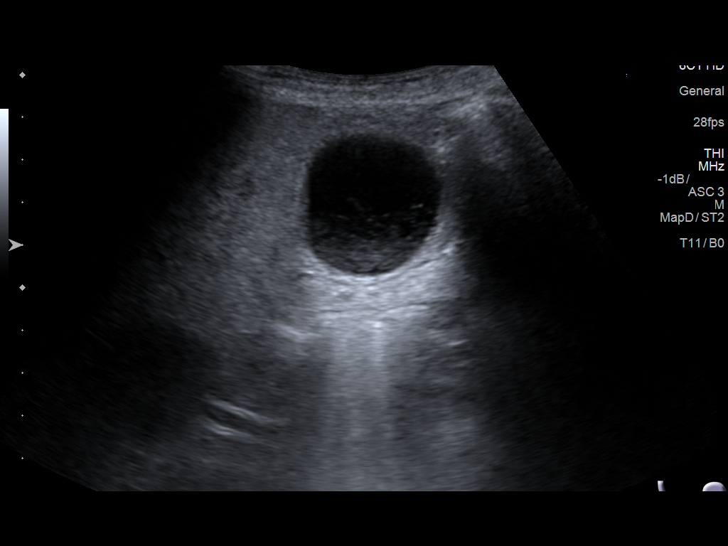
[im 25/54]
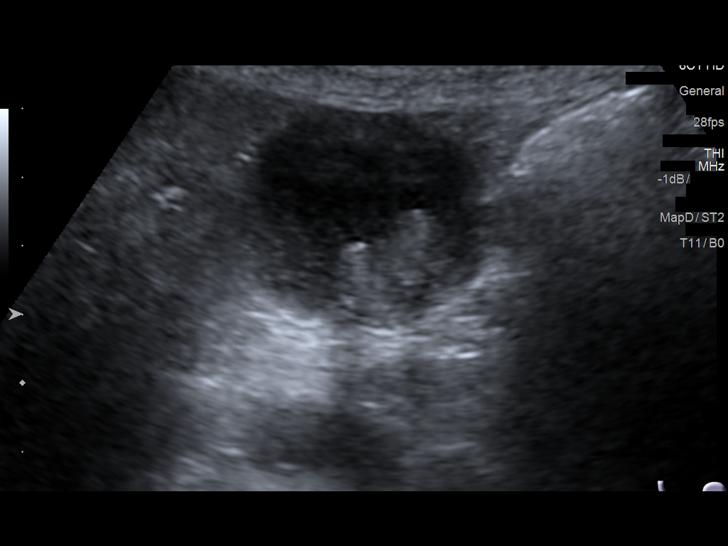
[im 29/54]
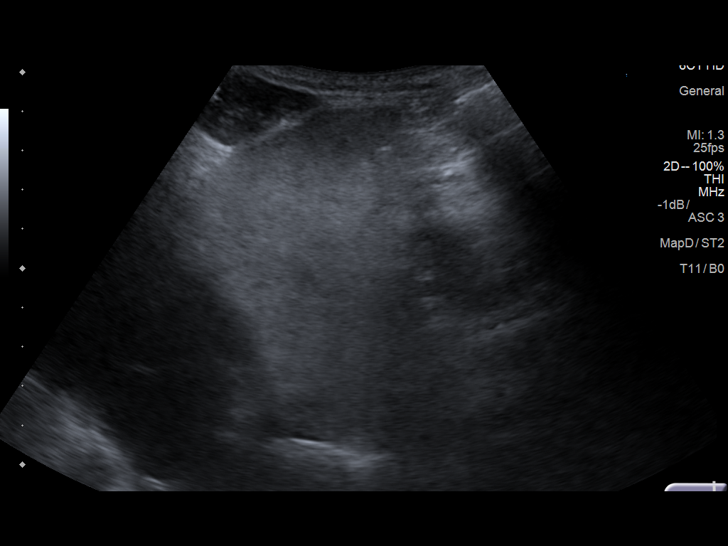
[im 34/54]
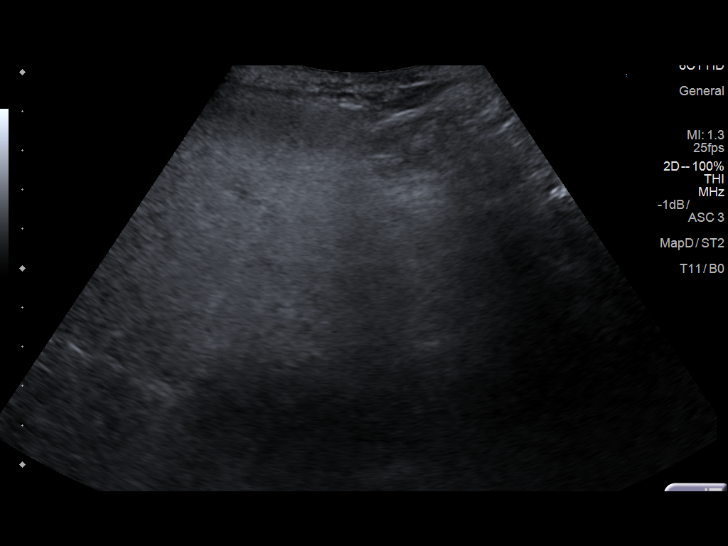
[im 36/54]
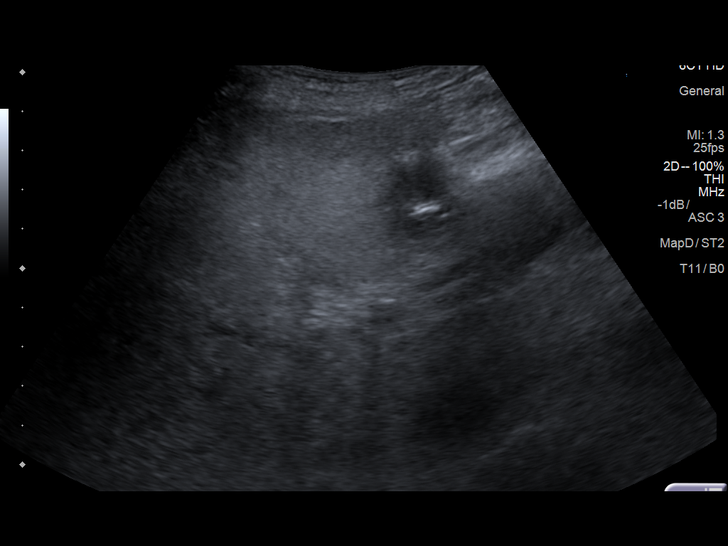
[im 40/54]
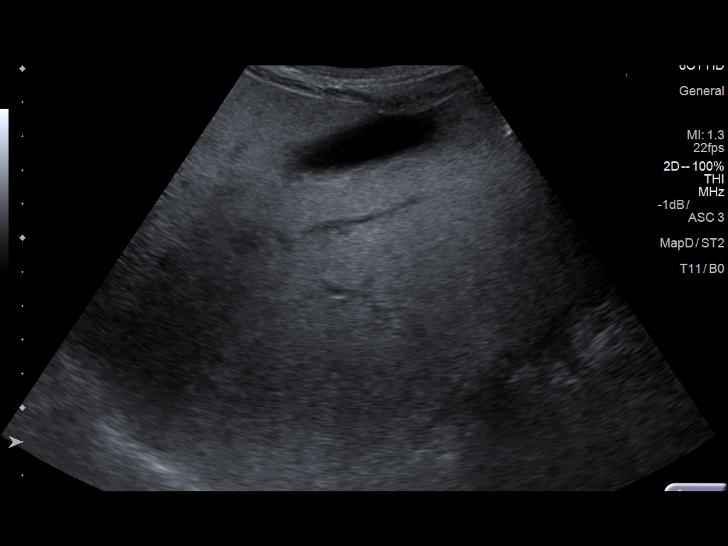
[im 45/54]
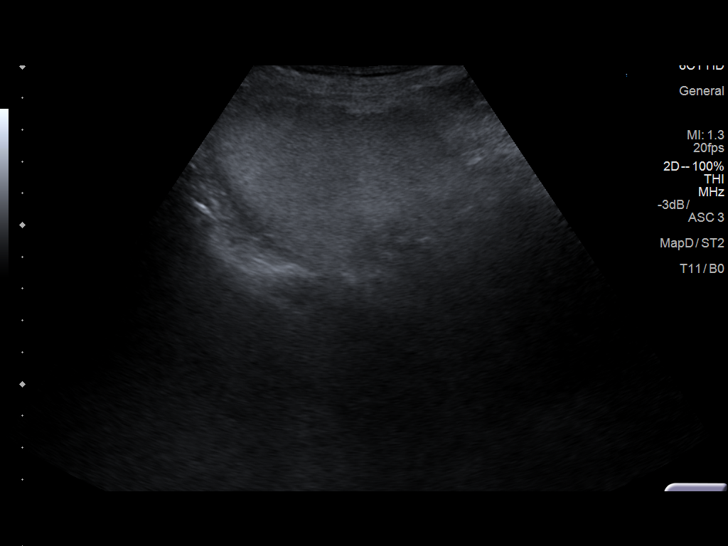
[im 49/54]
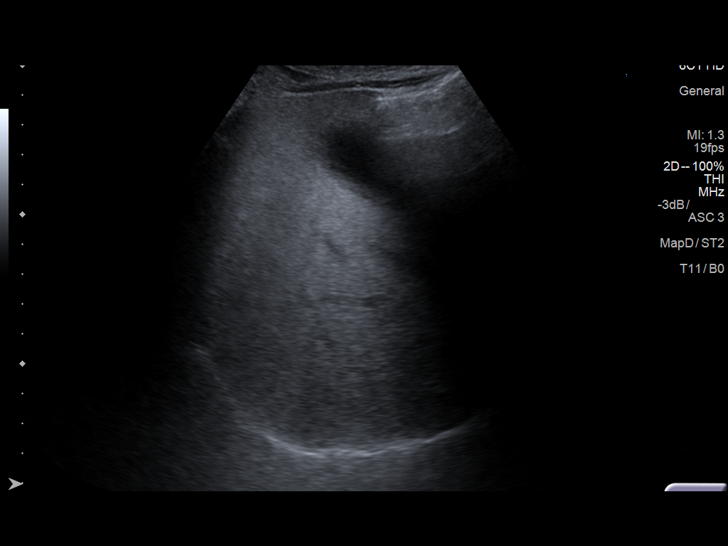
[im 54/54]
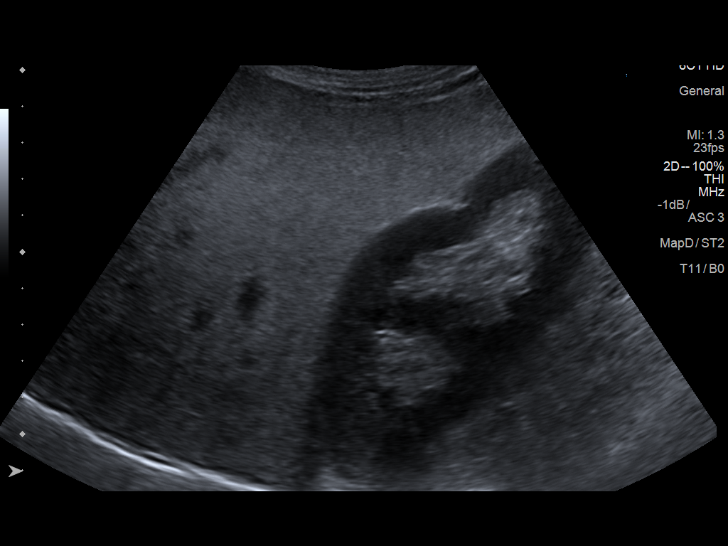

[14 of 25 positions shown; findings below may reference images not displayed]

FINDINGS: Gallbladder:

Sludge and stones within the mildly distended gallbladder, largest
stone measuring 9 mm greatest dimension. No gallbladder wall
thickening, pericholecystic fluid or other secondary signs of acute
cholecystitis.

Common bile duct:

Diameter: Normal at 5 mm.

Liver:

Liver is diffusely echogenic indicating fatty infiltration. No focal
mass or lesion identified within the liver, although the fatty
infiltration limits characterization of parenchymal detail.
IMPRESSION: 1. Sludge and stones within the gallbladder, largest stone measuring
9 mm. No evidence of acute cholecystitis.
2. No bile duct dilatation.
3. Fatty infiltration of the liver.

## 2017-10-06 IMAGING — CT CT ABD-PELV W/ CM
2 of 5 series · 15 of 46 positions shown, 17 images · IV contrast (Omni 300)
Comparison: 09/19/2015 abdominal sonogram.

CLINICAL DATA: Epigastric abdominal pain for 2 weeks. Vomiting.
Lower extremity swelling. Inpatient.

EXAM:
CT ABDOMEN AND PELVIS WITH CONTRAST
TECHNIQUE: Multidetector CT imaging of the abdomen and pelvis was performed
using the standard protocol following bolus administration of
intravenous contrast.
CONTRAST:  80mL [id number] IOPAMIDOL ([id number]) INJECTION 61%

[Series 2: a/p w/ 5mm · axial · 0.67mm/px · z∈[+946,+1356]mm · 12 of 92 slices shown, 14 images]
[im 5/92  soft-tissue]
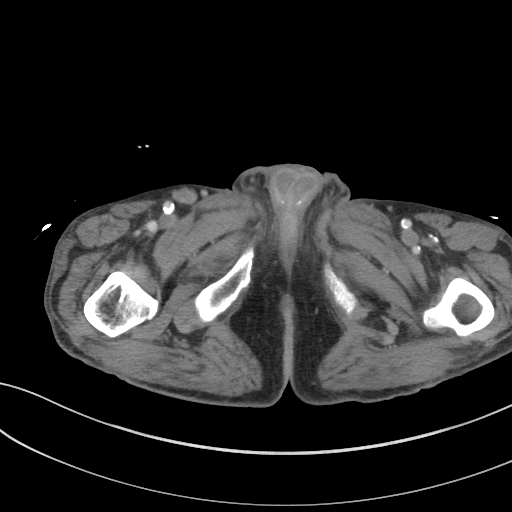
[im 5/92  bone]
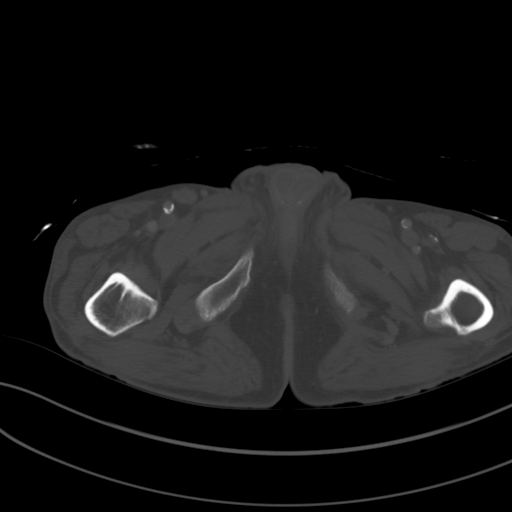
[im 14/92  soft-tissue]
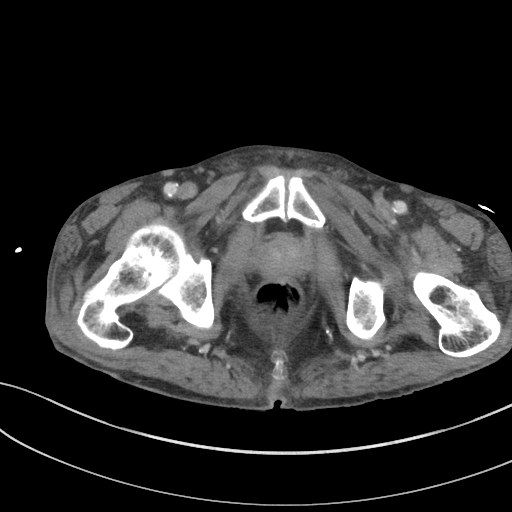
[im 19/92  soft-tissue]
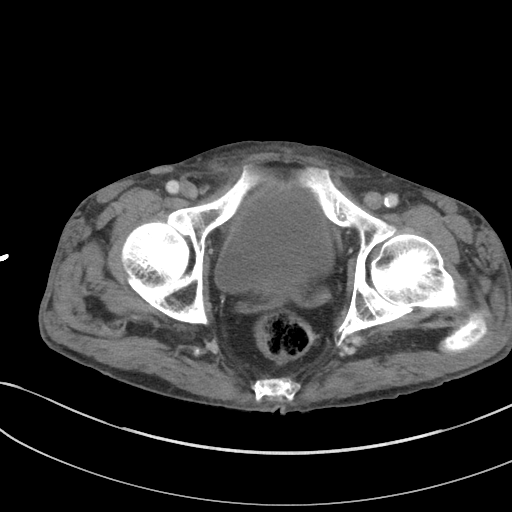
[im 28/92  soft-tissue]
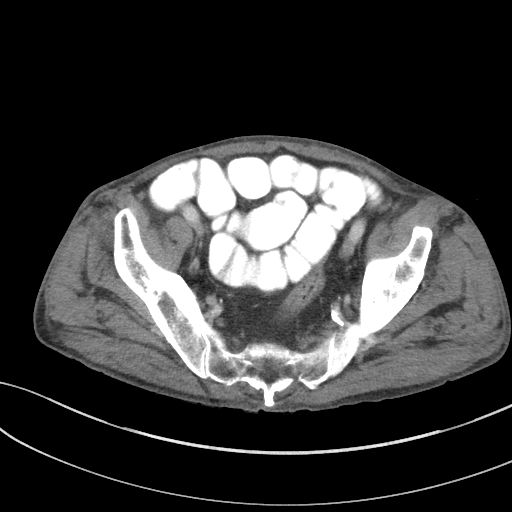
[im 37/92  soft-tissue]
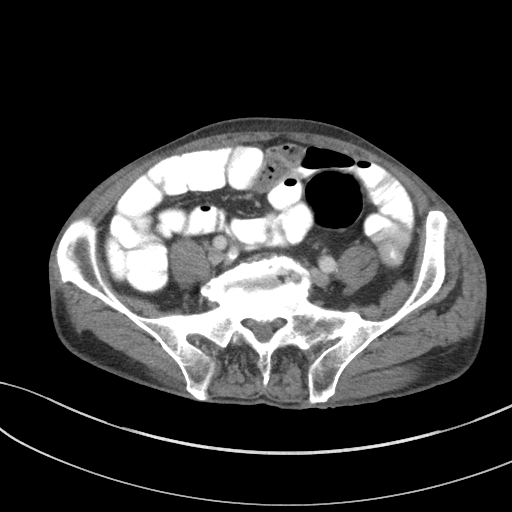
[im 41/92  soft-tissue]
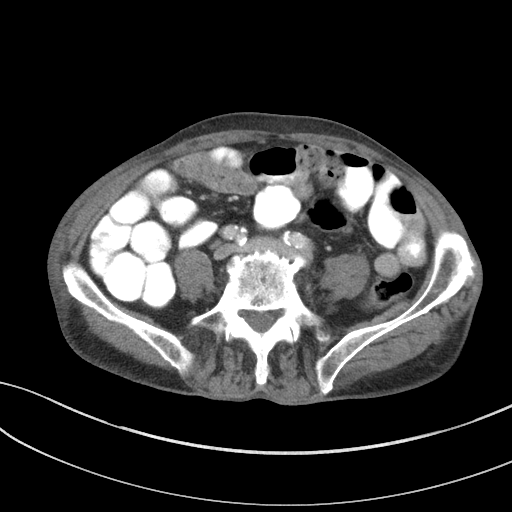
[im 51/92  soft-tissue]
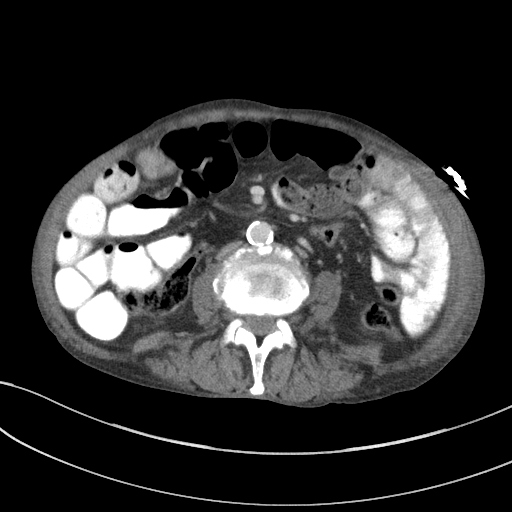
[im 55/92  soft-tissue]
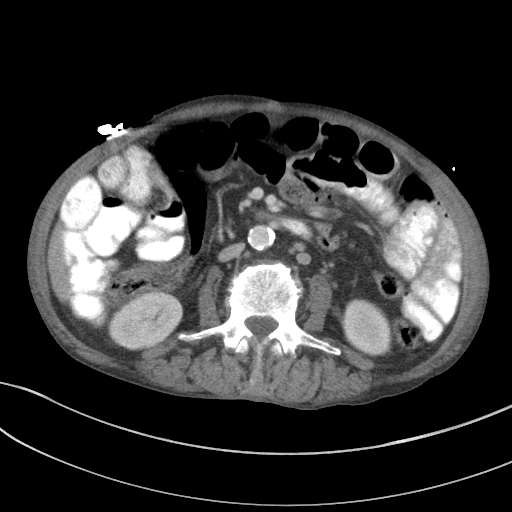
[im 64/92  soft-tissue]
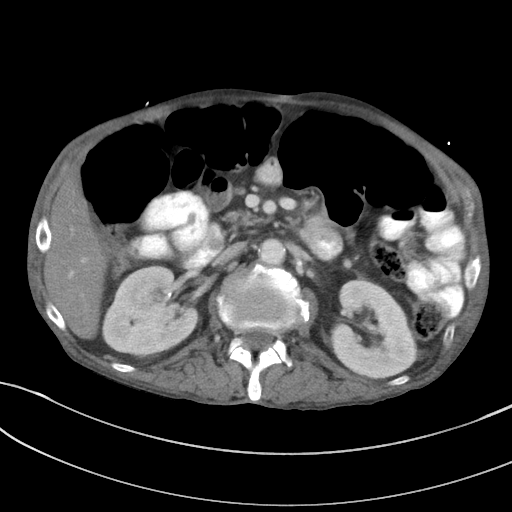
[im 64/92  bone]
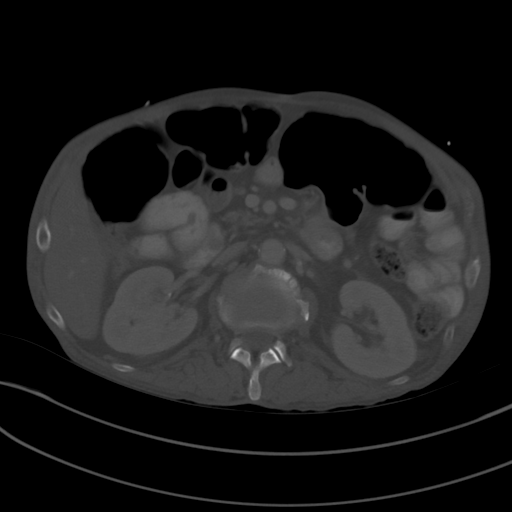
[im 73/92  soft-tissue]
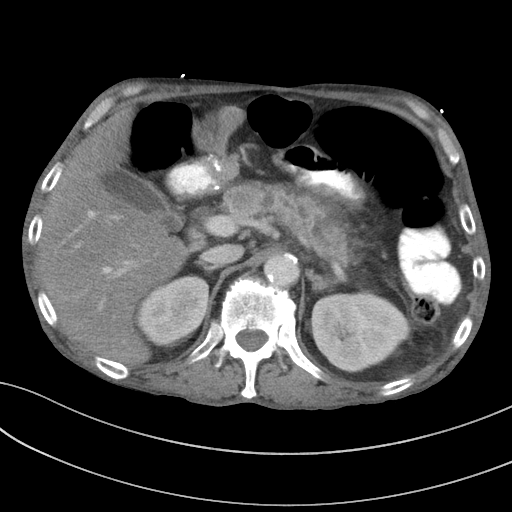
[im 78/92  soft-tissue]
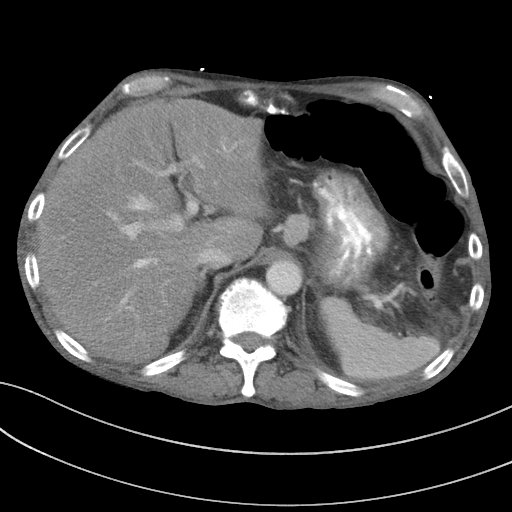
[im 87/92  soft-tissue]
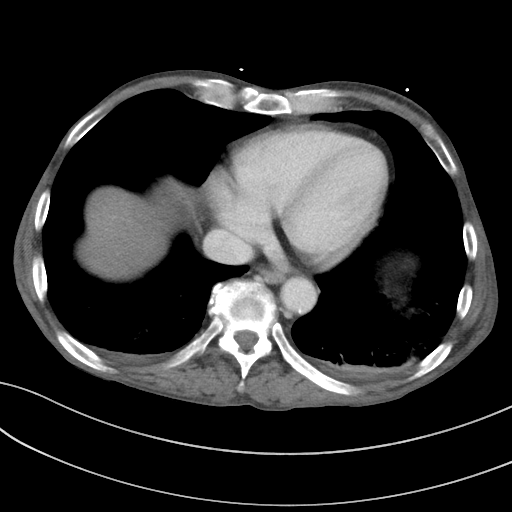

[Series 5: a/p w/ cor · coronal · 0.74mm/px · 3 of 123 slices shown]
[im 41/123  soft-tissue]
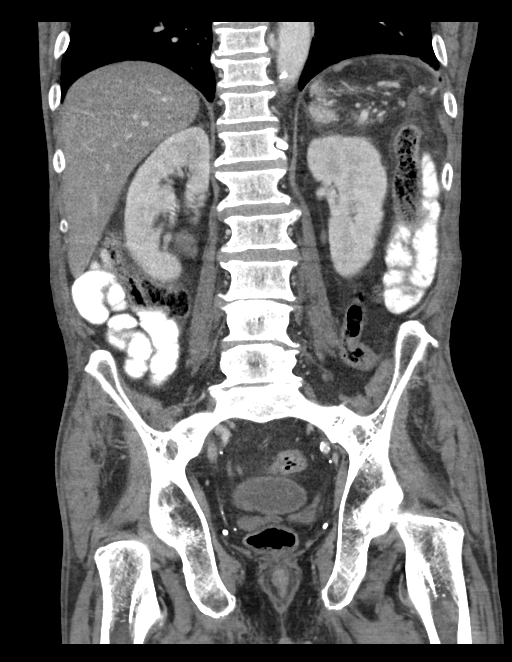
[im 55/123  soft-tissue]
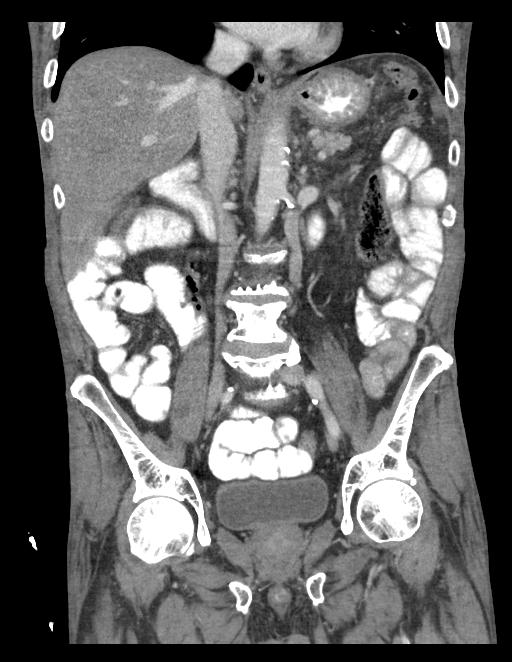
[im 68/123  soft-tissue]
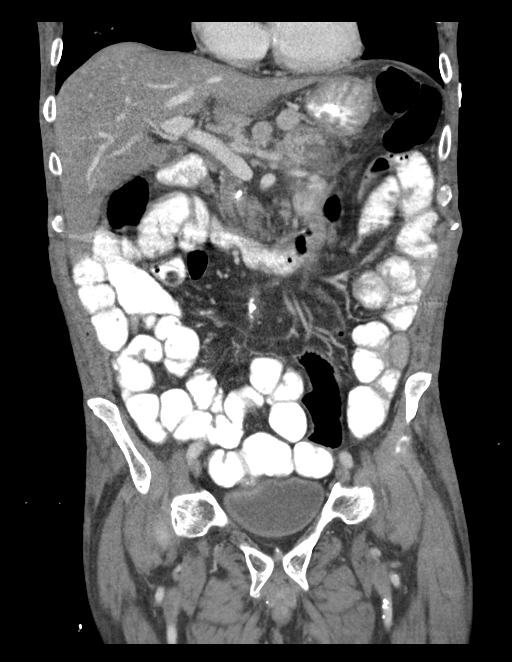

[15 of 46 positions shown; findings below may reference images not displayed]

FINDINGS: Lower chest: No significant pulmonary nodules or acute consolidative
airspace disease. Trace layering bilateral pleural effusions and
minimal dependent atelectasis in both lower lobes.

Hepatobiliary: Normal size liver. Diffuse hepatic steatosis. No
liver mass. No definite liver surface irregularity. Nondistended
gallbladder contains a 9 mm calcified gallstone. No gallbladder wall
thickening or pericholecystic fat stranding. No biliary ductal
dilatation. Common bile duct diameter 5 mm.

Pancreas: Coarse calcifications in the pancreatic head. Thickening
and parenchymal heterogeneity throughout the pancreatic body and
tail with associated peripancreatic fluid and fat stranding. These
findings are consistent with acute on chronic pancreatitis. There
are a few low-attenuation pancreatic body lesions, largest measuring
1.1 x 0.8 cm and 1.3 x 1.0 cm (series 2/ image 21). No areas of
pancreatic parenchymal nonenhancement or gas.

Spleen: Normal size. No mass.

Adrenals/Urinary Tract: Normal adrenals. No hydronephrosis. No renal
masses. Normal bladder.

Stomach/Bowel: Status post distal gastrectomy with
gastrojejunostomy. No definite wall thickening in the collapsed
remnant proximal stomach. Normal caliber small bowel with no small
bowel wall thickening. Normal appendix. Normal large bowel with no
diverticulosis, large bowel wall thickening or pericolonic fat
stranding.

Vascular/Lymphatic: Atherosclerotic nonaneurysmal abdominal aorta.
Patent portal, splenic, hepatic and renal veins. Enlarged 1.7 cm
gastrohepatic ligament node (series 2/ image 15). Porta hepatis
lymphadenopathy up to 1.5 cm (series 2/image 16). No additional
pathologically enlarged abdominopelvic nodes.

Reproductive: Normal size prostate.

Other: No pneumoperitoneum, ascites or focal intraperitoneal fluid
collection.

Musculoskeletal: No aggressive appearing focal osseous lesions.
Moderate degenerative changes in the visualized thoracolumbar spine.
IMPRESSION: 1. Acute on chronic pancreatitis. No evidence of necrotizing
pancreatitis.
2. Small low-attenuation lesions in the pancreatic body, favor
chronic and/or developing pseudocysts. Recommend attention on a
follow-up CT abdomen with IV contrast or MRI abdomen without and
with IV contrast in 3 months.
3. Trace layering bilateral pleural effusions.
4. Nonspecific mild upper retroperitoneal lymphadenopathy, which can
also be reassessed on follow-up CT or MRI abdomen in 3 months.
5. Cholelithiasis. No evidence of acute cholecystitis. No biliary
ductal dilatation.
6. Diffuse hepatic steatosis.
